# Patient Record
Sex: Female | Born: 1990 | Race: Black or African American | Hispanic: No | Marital: Single | Smoking: Current every day smoker
Health system: Southern US, Community
[De-identification: ages and names within clinical notes are randomized; demographics above are authoritative.]

## PROBLEM LIST (undated history)

## (undated) ENCOUNTER — Inpatient Hospital Stay (HOSPITAL_COMMUNITY): Payer: Self-pay

## (undated) DIAGNOSIS — N10 Acute tubulo-interstitial nephritis: Secondary | ICD-10-CM

## (undated) DIAGNOSIS — R509 Fever, unspecified: Secondary | ICD-10-CM

## (undated) DIAGNOSIS — Z789 Other specified health status: Secondary | ICD-10-CM

## (undated) HISTORY — PX: NO PAST SURGERIES: SHX2092

---
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
## 2008-03-11 ENCOUNTER — Emergency Department (HOSPITAL_COMMUNITY): Admission: EM | Admit: 2008-03-11 | Discharge: 2008-03-12 | Payer: Self-pay | Admitting: Emergency Medicine

## 2008-06-06 ENCOUNTER — Emergency Department (HOSPITAL_COMMUNITY): Admission: EM | Admit: 2008-06-06 | Discharge: 2008-06-06 | Payer: Self-pay | Admitting: Emergency Medicine

## 2009-12-03 ENCOUNTER — Emergency Department (HOSPITAL_COMMUNITY): Admission: EM | Admit: 2009-12-03 | Discharge: 2009-12-03 | Payer: Self-pay | Admitting: Family Medicine

## 2011-08-25 LAB — RAPID STREP SCREEN (MED CTR MEBANE ONLY): Streptococcus, Group A Screen (Direct): NEGATIVE

## 2012-09-12 ENCOUNTER — Emergency Department (HOSPITAL_COMMUNITY)
Admission: EM | Admit: 2012-09-12 | Discharge: 2012-09-12 | Disposition: A | Discharge status: Home / Self Care | Payer: Self-pay | Attending: Emergency Medicine | Admitting: Emergency Medicine

## 2012-09-12 ENCOUNTER — Emergency Department (HOSPITAL_COMMUNITY): Payer: Self-pay

## 2012-09-12 ENCOUNTER — Encounter (HOSPITAL_COMMUNITY): Payer: Self-pay | Admitting: Emergency Medicine

## 2012-09-12 DIAGNOSIS — O200 Threatened abortion: Secondary | ICD-10-CM | POA: Insufficient documentation

## 2012-09-12 DIAGNOSIS — Z3490 Encounter for supervision of normal pregnancy, unspecified, unspecified trimester: Secondary | ICD-10-CM

## 2012-09-12 LAB — CBC WITH DIFFERENTIAL/PLATELET
Basophils Absolute: 0 10*3/uL (ref 0.0–0.1)
Lymphocytes Relative: 30 % (ref 12–46)
Lymphs Abs: 1.2 10*3/uL (ref 0.7–4.0)
Neutro Abs: 2.4 10*3/uL (ref 1.7–7.7)
Neutrophils Relative %: 64 % (ref 43–77)
Platelets: 177 10*3/uL (ref 150–400)
RBC: 4.7 MIL/uL (ref 3.87–5.11)
RDW: 14.1 % (ref 11.5–15.5)
WBC: 3.8 10*3/uL — ABNORMAL LOW (ref 4.0–10.5)

## 2012-09-12 LAB — URINALYSIS, ROUTINE W REFLEX MICROSCOPIC
Hgb urine dipstick: NEGATIVE
Nitrite: NEGATIVE
Specific Gravity, Urine: 1.003 — ABNORMAL LOW (ref 1.005–1.030)
Urobilinogen, UA: 0.2 mg/dL (ref 0.0–1.0)

## 2012-09-12 LAB — URINE MICROSCOPIC-ADD ON

## 2012-09-12 LAB — WET PREP, GENITAL: Trich, Wet Prep: NONE SEEN

## 2012-09-12 MED ORDER — NITROFURANTOIN MONOHYD MACRO 100 MG PO CAPS: 100.0000 mg | ORAL_CAPSULE | Freq: Two times a day (BID) | ORAL | Status: DC

## 2012-09-12 NOTE — ED Notes (Signed)
[facility] called and said that pt is not a candidate for Rhogam bc she is B+

## 2012-09-12 NOTE — ED Notes (Signed)
Pt resting comfortably states her pain "just went away."

## 2012-09-12 NOTE — ED Provider Notes (Signed)
Medical screening examination/treatment/procedure(s) were performed by non-physician practitioner and as supervising physician I was immediately available for consultation/collaboration.   Gerhard Munch, MD 09/12/12 2028

## 2012-09-12 NOTE — ED Provider Notes (Signed)
History     CSN: [id number]  Arrival date & time 09/12/12  1707   First MD Initiated Contact with Patient 09/12/12 1708      Chief Complaint  Patient presents with  . Abdominal Pain    (Consider location/radiation/quality/duration/timing/severity/associated sxs/prior treatment) HPI Comments: This is a 21 year old female, who presents to the ED with a chief complaint of abdominal pain.  The patient states that the pain began 2 days ago.  She states that the pain is in her right lower quadrant.  The pain is accompanied by nausea and intermittent vomiting.  Patient denies fever, anorexia, diarrhea, and constipation.  The pain does not radiate.  The patient has not tried taking anything for the pain.  The patient states that her LMP was in August.  The history is provided by the patient. No language interpreter was used.    No past medical history on file.  No past surgical history on file.  No family history on file.  History  Substance Use Topics  . Smoking status: Not on file  . Smokeless tobacco: Not on file  . Alcohol Use: Not on file    OB History    No data available      Review of Systems  Constitutional: Negative for fever and chills.  HENT: Negative for congestion.   Eyes: Negative for visual disturbance.  Respiratory: Negative for chest tightness and shortness of breath.   Cardiovascular: Negative for chest pain.  Gastrointestinal: Positive for abdominal pain. Negative for nausea, vomiting, diarrhea, constipation and blood in stool.  Genitourinary: Positive for menstrual problem. Negative for dysuria, vaginal bleeding, vaginal discharge and vaginal pain.  Musculoskeletal: Negative for back pain.  Neurological: Negative for weakness.  Psychiatric/Behavioral: The patient is not nervous/anxious.   All other systems reviewed and are negative.    Allergies  Review of patient's allergies indicates no known allergies.  Home Medications  No current outpatient  prescriptions on file.  BP 120/67  Pulse 84  Temp 97.8 F (36.6 C) (Oral)  Resp 18  SpO2 100%  LMP 06/28/2012  Physical Exam  Nursing note and vitals reviewed. Constitutional: She is oriented to person, place, and time. She appears well-developed and well-nourished.  HENT:  Head: Normocephalic and atraumatic.  Eyes: Conjunctivae normal and EOM are normal. Pupils are equal, round, and reactive to light.  Neck: Normal range of motion. Neck supple.  Cardiovascular: Normal rate, regular rhythm and normal heart sounds.   Pulmonary/Chest: Effort normal and breath sounds normal.  Abdominal: Soft. Bowel sounds are normal. She exhibits no mass. There is tenderness. There is no rebound and no guarding.  Genitourinary: Pelvic exam was performed with patient supine. There is no rash, tenderness, lesion or injury on the right labia. There is no rash, tenderness, lesion or injury on the left labia. Uterus is not deviated, not enlarged, not fixed and not tender. Cervix exhibits discharge. Cervix exhibits no motion tenderness and no friability. Right adnexum displays no mass, no tenderness and no fullness. Left adnexum displays no mass, no tenderness and no fullness. No erythema, tenderness or bleeding around the vagina. No foreign body around the vagina. No signs of injury around the vagina. Vaginal discharge found.       Small amount of blood coming from the OS  Musculoskeletal: Normal range of motion.  Neurological: She is alert and oriented to person, place, and time.  Skin: Skin is warm and dry.  Psychiatric: She has a normal mood and affect. Her  behavior is normal. Judgment and thought content normal.    ED Course  Procedures (including critical care time)   Labs Reviewed  URINALYSIS, ROUTINE W REFLEX MICROSCOPIC  CBC WITH DIFFERENTIAL  GC/CHLAMYDIA PROBE AMP, GENITAL  WET PREP, GENITAL   Results for orders placed during the hospital encounter of 09/12/12  URINALYSIS, ROUTINE W REFLEX  MICROSCOPIC      Component Value Range   Color, Urine YELLOW  YELLOW   APPearance CLOUDY (*) CLEAR   Specific Gravity, Urine 1.003 (*) 1.005 - 1.030   pH 6.0  5.0 - 8.0   Glucose, UA NEGATIVE  NEGATIVE mg/dL   Hgb urine dipstick NEGATIVE  NEGATIVE   Bilirubin Urine NEGATIVE  NEGATIVE   Ketones, ur NEGATIVE  NEGATIVE mg/dL   Protein, ur NEGATIVE  NEGATIVE mg/dL   Urobilinogen, UA 0.2  0.0 - 1.0 mg/dL   Nitrite NEGATIVE  NEGATIVE   Leukocytes, UA MODERATE (*) NEGATIVE  CBC WITH DIFFERENTIAL      Component Value Range   WBC 3.8 (*) 4.0 - 10.5 K/uL   RBC 4.70  3.87 - 5.11 MIL/uL   Hemoglobin 12.1  12.0 - 15.0 g/dL   HCT 45.4  09.8 - 11.9 %   MCV 76.8 (*) 78.0 - 100.0 fL   MCH 25.7 (*) 26.0 - 34.0 pg   MCHC 33.5  30.0 - 36.0 g/dL   RDW 14.7  82.9 - 56.2 %   Platelets 177  150 - 400 K/uL   Neutrophils Relative 64  43 - 77 %   Neutro Abs 2.4  1.7 - 7.7 K/uL   Lymphocytes Relative 30  12 - 46 %   Lymphs Abs 1.2  0.7 - 4.0 K/uL   Monocytes Relative 5  3 - 12 %   Monocytes Absolute 0.2  0.1 - 1.0 K/uL   Eosinophils Relative 1  0 - 5 %   Eosinophils Absolute 0.0  0.0 - 0.7 K/uL   Basophils Relative 0  0 - 1 %   Basophils Absolute 0.0  0.0 - 0.1 K/uL  WET PREP, GENITAL      Component Value Range   Yeast Wet Prep HPF POC NONE SEEN  NONE SEEN   Trich, Wet Prep NONE SEEN  NONE SEEN   Clue Cells Wet Prep HPF POC FEW (*) NONE SEEN   WBC, Wet Prep HPF POC FEW (*) NONE SEEN  POCT PREGNANCY, URINE      Component Value Range   Preg Test, Ur POSITIVE (*) NEGATIVE  RH IG WORKUP (INCLUDES ABO/RH)      Component Value Range   Gestational Age(Wks) 9     ABO/RH(D) B POS     No rh immune globuloin       Value: NOT A RH IMMUNE GLOBULIN CANDIDATE, PT RH POSITIVE RESULT CALLED TO, READ BACK BY AND VERIFIED WITH: Sampson Si RN 1922 09/12/12 A NAVARRO  URINE MICROSCOPIC-ADD ON      Component Value Range   Squamous Epithelial / LPF FEW (*) RARE   WBC, UA 11-20  <3 WBC/hpf   Bacteria, UA FEW (*)  RARE  ABO/RH      Component Value Range   ABO/RH(D) B POS     US Ob Comp Less 14 Wks  09/12/2012  *RADIOLOGY REPORT*  Clinical Data: Right lower quadrant abdominal pain.  OBSTETRIC <14 WK [place] AND TRANSVAGINAL OB US  Technique:  Both transabdominal and transvaginal ultrasound examinations were performed for complete evaluation of the gestation as  well as the maternal uterus, adnexal regions, and pelvic cul-de-sac.  Transvaginal technique was performed to assess early pregnancy.  Comparison:  None.  Intrauterine gestational sac:  Visualized/normal in shape. Embryo: Present Cardiac Activity: Present Heart Rate: 168, within normal limits bpm  CRL: 6.0  mm  12 w  3 d             [place] EDC: 03/18/2013  Maternal uterus/adnexae: Normal.  IMPRESSION: Single intrauterine pregnancy with an estimated gestational age of [redacted] weeks and 3 days.  Normal heart rate and movement are identified.   Original Report Authenticated By: Jamesetta Orleans. MATTERN, M.D.        1. Pregnancy   2. Threatened abortion in early pregnancy       MDM  21 year old with abdominal pain.  Positive pregnancy.  U/S results are detailed above.  I am going to refer the patient to OBGYN for care of this pregnancy.  As there was a small amount of blood from the cervical OS, will educate the patient regarding threatened abortion.  Return precautions have been given.  Patient also has UTI, will give Macrobid. The patient is stable and ready for discharge.  I have discussed this patient with Dr. Jeraldine Loots, who agrees with this treatment plan.        Roxy Horseman, PA-C 09/12/12 2012

## 2012-09-12 NOTE — ED Notes (Signed)
Pt c/o lower abd pain for the last 2 days. Pt states pain is sharp and intermittent. Pt denies n/v/d.

## 2012-09-12 NOTE — ED Notes (Signed)
Bedside report received from previous RN 

## 2012-09-13 LAB — GC/CHLAMYDIA PROBE AMP, GENITAL
Chlamydia, DNA Probe: POSITIVE — AB
GC Probe Amp, Genital: NEGATIVE

## 2012-09-14 NOTE — ED Notes (Signed)
+  Chlamydia Chart sent to EDP office for review.  

## 2012-09-17 NOTE — ED Notes (Signed)
Chart returned from EDP office . Give Azithromycin 1 gram po single dose.All sexual partners must be tested and treated must be tested and treated. Must follow up with OB this week per [facility].

## 2012-10-22 NOTE — ED Notes (Signed)
Unable to contact via phone. Letter sent.

## 2012-11-03 ENCOUNTER — Emergency Department (HOSPITAL_COMMUNITY): Payer: Self-pay

## 2012-11-03 ENCOUNTER — Emergency Department (HOSPITAL_COMMUNITY)
Admission: EM | Admit: 2012-11-03 | Discharge: 2012-11-03 | Disposition: A | Discharge status: Home / Self Care | Payer: Self-pay | Attending: Emergency Medicine | Admitting: Emergency Medicine

## 2012-11-03 ENCOUNTER — Encounter (HOSPITAL_COMMUNITY): Payer: Self-pay | Admitting: *Deleted

## 2012-11-03 DIAGNOSIS — O9989 Other specified diseases and conditions complicating pregnancy, childbirth and the puerperium: Secondary | ICD-10-CM | POA: Insufficient documentation

## 2012-11-03 DIAGNOSIS — Z79899 Other long term (current) drug therapy: Secondary | ICD-10-CM | POA: Insufficient documentation

## 2012-11-03 DIAGNOSIS — N12 Tubulo-interstitial nephritis, not specified as acute or chronic: Secondary | ICD-10-CM | POA: Insufficient documentation

## 2012-11-03 DIAGNOSIS — F17200 Nicotine dependence, unspecified, uncomplicated: Secondary | ICD-10-CM | POA: Insufficient documentation

## 2012-11-03 LAB — COMPREHENSIVE METABOLIC PANEL
ALT: 7 U/L (ref 0–35)
AST: 13 U/L (ref 0–37)
Alkaline Phosphatase: 50 U/L (ref 39–117)
Calcium: 8.8 mg/dL (ref 8.4–10.5)
Glucose, Bld: 90 mg/dL (ref 70–99)
Potassium: 2.9 mEq/L — ABNORMAL LOW (ref 3.5–5.1)
Sodium: 132 mEq/L — ABNORMAL LOW (ref 135–145)
Total Protein: 6.2 g/dL (ref 6.0–8.3)

## 2012-11-03 LAB — CBC WITH DIFFERENTIAL/PLATELET
Basophils Absolute: 0 10*3/uL (ref 0.0–0.1)
Eosinophils Absolute: 0 10*3/uL (ref 0.0–0.7)
Eosinophils Relative: 0 % (ref 0–5)
Lymphocytes Relative: 13 % (ref 12–46)
Lymphs Abs: 0.8 10*3/uL (ref 0.7–4.0)
MCH: 26.9 pg (ref 26.0–34.0)
MCV: 78.4 fL (ref 78.0–100.0)
Neutrophils Relative %: 82 % — ABNORMAL HIGH (ref 43–77)
Platelets: 168 10*3/uL (ref 150–400)
RBC: 3.98 MIL/uL (ref 3.87–5.11)
RDW: 14.3 % (ref 11.5–15.5)
WBC: 6.6 10*3/uL (ref 4.0–10.5)

## 2012-11-03 LAB — URINE MICROSCOPIC-ADD ON

## 2012-11-03 LAB — URINALYSIS, ROUTINE W REFLEX MICROSCOPIC
Bilirubin Urine: NEGATIVE
Glucose, UA: NEGATIVE mg/dL
Specific Gravity, Urine: 1.015 (ref 1.005–1.030)
Urobilinogen, UA: 1 mg/dL (ref 0.0–1.0)
pH: 6.5 (ref 5.0–8.0)

## 2012-11-03 MED ORDER — OXYCODONE-ACETAMINOPHEN 5-325 MG PO TABS: 1.0000 | ORAL_TABLET | ORAL | Status: DC | PRN

## 2012-11-03 MED ORDER — ONDANSETRON 8 MG PO TBDP: 8.0000 mg | ORAL_TABLET | Freq: Three times a day (TID) | ORAL | Status: DC | PRN

## 2012-11-03 MED ORDER — DEXTROSE 5 % IV SOLN
1.0000 g | Freq: Once | INTRAVENOUS | Status: AC
  Administered 2012-11-03: 1 g via INTRAVENOUS
  Filled 2012-11-03: qty 10

## 2012-11-03 MED ORDER — MORPHINE SULFATE 4 MG/ML IJ SOLN
6.0000 mg | Freq: Once | INTRAMUSCULAR | Status: AC
  Administered 2012-11-03: 6 mg via INTRAVENOUS
  Filled 2012-11-03: qty 2

## 2012-11-03 MED ORDER — CEPHALEXIN 500 MG PO CAPS: 500.0000 mg | ORAL_CAPSULE | Freq: Four times a day (QID) | ORAL | Status: DC

## 2012-11-03 MED ORDER — POTASSIUM CHLORIDE CRYS ER 20 MEQ PO TBCR
40.0000 meq | EXTENDED_RELEASE_TABLET | Freq: Once | ORAL | Status: AC
  Administered 2012-11-03: 40 meq via ORAL
  Filled 2012-11-03: qty 2

## 2012-11-03 NOTE — ED Notes (Signed)
MD at bedside. 

## 2012-11-03 NOTE — ED Notes (Addendum)
Pt states she has had right abdominal pain radiating to right flank since last night.  Pt denies N/V/D and fever.  Pt denies blood in urine, burning with urination or urinary frequency.  Pt denies vaginal discharge.  Pt is @ 4 months pregnant and has had no prenatal care.

## 2012-11-03 NOTE — ED Provider Notes (Signed)
History     CSN: [id number]  Arrival date & time 11/03/12  1140   First MD Initiated Contact with Patient 11/03/12 1158      Chief Complaint  Patient presents with  . Abdominal Pain     The history is provided by the patient.   Patient reports developing acute right-sided abdominal pain yesterday has been radiating to her flank since last night.  No nausea vomiting or diarrhea.  She reports urinary symptoms or vaginal discharge.  She is [redacted] weeks pregnant.  She's had no prenatal care to this point.  She had ultrasound obtained on October the demonstrate an intrauterine pregnancy.  She has no history of kidney stones.  There is no radiation or pain down her right groin.  No new vaginal complaints.  No urinary complaints.  She reports developing chills last night without documented fever.  No fever today.  Her pain is constant and is moderate in severity   History reviewed. No pertinent past medical history.  History reviewed. No pertinent past surgical history.  No family history on file.  History  Substance Use Topics  . Smoking status: Current Some Day Smoker    Types: Cigarettes  . Smokeless tobacco: Never Used  . Alcohol Use: No    OB History    Grav Para Term Preterm Abortions TAB SAB Ect Mult Living   2 1              Review of Systems  All other systems reviewed and are negative.    Allergies  Review of patient's allergies indicates no known allergies.  Home Medications   Current Outpatient Rx  Name  Route  Sig  Dispense  Refill  . NITROFURANTOIN MONOHYD MACRO 100 MG PO CAPS   Oral   Take 1 capsule (100 mg total) by mouth 2 (two) times daily.   10 capsule   0     BP 126/79  Pulse 84  Temp 98 F (36.7 C) (Oral)  Resp 16  SpO2 99%  LMP 06/28/2012  Physical Exam  Nursing note and vitals reviewed. Constitutional: She is oriented to person, place, and time. She appears well-developed and well-nourished. No distress.  HENT:  Head: Normocephalic  and atraumatic.  Eyes: EOM are normal.  Neck: Normal range of motion.  Cardiovascular: Normal rate, regular rhythm and normal heart sounds.   Pulmonary/Chest: Effort normal and breath sounds normal.  Abdominal: Soft. She exhibits no distension.       Right-sided abdominal tenderness  Genitourinary:       Right-sided flank tenderness  Musculoskeletal: Normal range of motion.  Neurological: She is alert and oriented to person, place, and time.  Skin: Skin is warm and dry.  Psychiatric: She has a normal mood and affect. Judgment normal.    ED Course  Procedures (including critical care time)  Labs Reviewed  CBC WITH DIFFERENTIAL - Abnormal; Notable for the following:    Hemoglobin 10.7 (*)     HCT 31.2 (*)     Neutrophils Relative 82 (*)     All other components within normal limits  COMPREHENSIVE METABOLIC PANEL - Abnormal; Notable for the following:    Sodium 132 (*)     Potassium 2.9 (*)     Creatinine, Ser 0.42 (*)     Albumin 2.8 (*)     All other components within normal limits  URINALYSIS, ROUTINE W REFLEX MICROSCOPIC - Abnormal; Notable for the following:    APPearance CLOUDY (*)  Hgb urine dipstick LARGE (*)     Protein, ur 30 (*)     Leukocytes, UA LARGE (*)     All other components within normal limits  URINE MICROSCOPIC-ADD ON - Abnormal; Notable for the following:    Squamous Epithelial / LPF FEW (*)     Bacteria, UA MANY (*)     All other components within normal limits  LIPASE, BLOOD  URINE CULTURE   US Abdomen Complete  11/03/2012  *RADIOLOGY REPORT*  Clinical Data:  21 year old pregnant female with abdominal pain.  ABDOMINAL ULTRASOUND COMPLETE  Comparison:  None  Findings:  Gallbladder:  The gallbladder is unremarkable. There is no evidence of gallstones, gallbladder wall thickening, or pericholecystic fluid.  Common Bile Duct:  There is no evidence of intrahepatic or extrahepatic biliary dilation. The CBD measures 2.9 mm in greatest diameter.  Liver:   The liver is within normal limits in parenchymal echogenicity. No focal abnormalities are identified.  IVC:  Appears normal.  Pancreas:  Although the pancreas is difficult to visualize in its entirety, no focal pancreatic abnormality is identified.  Spleen:  Within normal limits in size and echotexture.  Right kidney:  The right kidney is normal in size and parenchymal echogenicity.  There is no evidence of solid mass or definite renal calculi.  Very mild right hydronephrosis is likely related to pregnancy.  The right kidney measures 13.7 cm.  Left kidney:  The left kidney is normal in size and parenchymal echogenicity.  There is no evidence of solid mass, hydronephrosis or definite renal calculi.   The left kidney measures 12.3 cm.  Abdominal Aorta:  No abdominal aortic aneurysm identified.  Bladder:  Debris within the bladder is noted which may signify infection.  There is no evidence of ascites.  IMPRESSION: Debris within the bladder which may signify infection.  Mild right hydronephrosis - most likely related to hydronephrosis of pregnancy but correlate clinically.  No other significant abnormalities identified.   Original Report Authenticated By: Harmon Pier, M.D.    I personally reviewed the imaging tests through PACS system I reviewed available ER/hospitalization records through the EMR   1. Pyelonephritis       MDM  She appears to have a urinary tract infection and likely right-sided pyelonephritis.  This may be isolated of pyelonephritis however on ultrasound she appears to have a small amount of right-sided hydronephrosis as compared to her left side.  There is no obvious stone noted.  Radiology comments that this may be hydronephrosis secondary to pregnancy alone however given her pain and her urinary tract infection i will speak with the urologist  4:10 PM I spoke with the urologist Dr. Isabel Caprice, who recommends close followup with OB/GYN and return to [facility] hospital for new or worsening  symptoms.  He states this time it does not Selleck the patient needs a ureteral stent.  She is afebrile her white count is normal.  After pain medication she feels much better.  She'll be discharged home on Keflex, pain medicine, and nausea medicine.  She really has no nausea or vomiting at this time.  I've asked that the patient return immediately to the ER [facility] for any new or worsening symptoms including fever, worsening pain, severe nausea vomiting and inability to keep fluids down.  She is currently scheduled to followup at [facility] clinic for routine obstetrical care.  I recommended prenatal vitamins and to not smoke, drink alcohol, use illicit drugs.  The patient is agreeable  to outpatient plan.  She was given potassium supplementation in the emergency department      Lyanne Co, MD 11/03/12 [phone]

## 2012-11-05 LAB — URINE CULTURE

## 2012-11-06 NOTE — ED Notes (Signed)
+   urine  Patient treated appropriately -sensitive to same-chart appended per protocol MD.  

## 2012-11-13 ENCOUNTER — Inpatient Hospital Stay (HOSPITAL_COMMUNITY)
Admission: AD | Admit: 2012-11-13 | Discharge: 2012-11-14 | Disposition: A | Discharge status: Home / Self Care | Payer: Self-pay | Source: Ambulatory Visit | Attending: Obstetrics and Gynecology | Admitting: Obstetrics and Gynecology

## 2012-11-13 ENCOUNTER — Encounter (HOSPITAL_COMMUNITY): Payer: Self-pay | Admitting: Advanced Practice Midwife

## 2012-11-13 DIAGNOSIS — O99891 Other specified diseases and conditions complicating pregnancy: Secondary | ICD-10-CM

## 2012-11-13 DIAGNOSIS — R1010 Upper abdominal pain, unspecified: Secondary | ICD-10-CM

## 2012-11-13 DIAGNOSIS — Z3490 Encounter for supervision of normal pregnancy, unspecified, unspecified trimester: Secondary | ICD-10-CM

## 2012-11-13 DIAGNOSIS — O0930 Supervision of pregnancy with insufficient antenatal care, unspecified trimester: Secondary | ICD-10-CM

## 2012-11-13 DIAGNOSIS — R109 Unspecified abdominal pain: Secondary | ICD-10-CM

## 2012-11-13 HISTORY — DX: Other specified health status: Z78.9

## 2012-11-13 MED ORDER — GI COCKTAIL ~~LOC~~
30.0000 mL | Freq: Once | ORAL | Status: AC
  Administered 2012-11-13: 30 mL via ORAL
  Filled 2012-11-13: qty 30

## 2012-11-13 NOTE — MAU Provider Note (Signed)
  History     CSN: [id number]  Arrival date and time: 11/13/12 2214   First Provider Initiated Contact with Patient 11/13/12 2307      Chief Complaint  Patient presents with  . Abdominal Pain   HPI Kristen Garza 21 y.o. [redacted]w[redacted]d  Comes to MAU tonight with upper abdominal pain.  Denies any vomiting.  Also needs verification of pregnancy.  Has an appointment in the [facility] clinic on 11-19-12 to begin prenatal care.  OB History    Grav Para Term Preterm Abortions TAB SAB Ect Mult Living   2 1 1       1       Past Medical History  Diagnosis Date  . No pertinent past medical history     Past Surgical History  Procedure Date  . No past surgeries     No family history on file.  History  Substance Use Topics  . Smoking status: Current Some Day Smoker -- 0.2 packs/day    Types: Cigarettes  . Smokeless tobacco: Never Used  . Alcohol Use: No    Allergies: No Known Allergies  Prescriptions prior to admission  Medication Sig Dispense Refill  . cephALEXin (KEFLEX) 500 MG capsule Take 1 capsule (500 mg total) by mouth 4 (four) times daily.  28 capsule  0  . ondansetron (ZOFRAN ODT) 8 MG disintegrating tablet Take 1 tablet (8 mg total) by mouth every 8 (eight) hours as needed for nausea.  12 tablet  0  . oxyCODONE-acetaminophen (PERCOCET/ROXICET) 5-325 MG per tablet Take 1 tablet by mouth every 4 (four) hours as needed for pain.  20 tablet  0  . Prenatal Vit-Fe Fumarate-FA (MULTIVITAMIN-PRENATAL) 27-0.8 MG TABS Take 1 tablet by mouth daily.        Review of Systems  Constitutional: Negative for fever.  Gastrointestinal: Positive for abdominal pain. Negative for nausea, vomiting, diarrhea and constipation.  Genitourinary:       No vaginal discharge. No vaginal bleeding. No dysuria.   Physical Exam   Blood pressure 124/75, pulse 85, temperature 98.1 F (36.7 C), temperature source Oral, resp. rate 16, height 5\' 4"  (1.626 m), weight 70.67 kg (155 lb 12.8 oz), last  menstrual period 07/04/2012.  Physical Exam  Nursing note and vitals reviewed. Constitutional: She is oriented to person, place, and time. She appears well-developed and well-nourished.  HENT:  Head: Normocephalic.  Eyes: EOM are normal.  Neck: Neck supple.  GI: Soft. There is tenderness. There is no rebound and no guarding.       FHT 140  Musculoskeletal: Normal range of motion.  Neurological: She is alert and oriented to person, place, and time.  Skin: Skin is warm and dry.  Psychiatric: She has a normal mood and affect.    MAU Course  Procedures  MDM   Assessment and Plan  Upper abdominal pain likely related to high fat foods in pregnancy]  Plan GI cocktail now. Pregnancy verification given Keep appointment in the clinic on 11-19-12 Expect ultrasound to call you and schedule an ultrasound. Do not eat high fat foods or other foods which cause upper abdominal pain for you. You can chew Tums by the package directions to help you when you have this type of pain.   [name] 11/13/2012, 11:12 PM

## 2012-11-13 NOTE — MAU Note (Signed)
Pt unsure of LMP, thinks it was in August.  +UPT at [facility] in October.  Tonight having upper abd pain.

## 2012-11-14 NOTE — MAU Provider Note (Signed)
Attestation of Attending Supervision of Advanced Practitioner (CNM/NP): Evaluation and management procedures were performed by the Advanced Practitioner under my supervision and collaboration.  I have reviewed the Advanced Practitioner's note and chart, and I agree with the management and plan.  Dandra Shambaugh 11/14/2012 7:30 AM   

## 2012-11-19 ENCOUNTER — Encounter: Payer: Self-pay | Admitting: Obstetrics and Gynecology

## 2012-11-23 ENCOUNTER — Encounter: Payer: Self-pay | Admitting: Advanced Practice Midwife

## 2012-11-23 ENCOUNTER — Ambulatory Visit (HOSPITAL_COMMUNITY)
Admission: RE | Admit: 2012-11-23 | Discharge: 2012-11-23 | Disposition: A | Discharge status: Home / Self Care | Payer: Self-pay | Source: Ambulatory Visit | Attending: Advanced Practice Midwife | Admitting: Advanced Practice Midwife

## 2012-11-23 DIAGNOSIS — Z363 Encounter for antenatal screening for malformations: Secondary | ICD-10-CM | POA: Insufficient documentation

## 2012-11-23 DIAGNOSIS — O0930 Supervision of pregnancy with insufficient antenatal care, unspecified trimester: Secondary | ICD-10-CM

## 2012-11-23 DIAGNOSIS — Z3490 Encounter for supervision of normal pregnancy, unspecified, unspecified trimester: Secondary | ICD-10-CM

## 2012-11-23 DIAGNOSIS — Z1389 Encounter for screening for other disorder: Secondary | ICD-10-CM | POA: Insufficient documentation

## 2012-11-23 DIAGNOSIS — O358XX0 Maternal care for other (suspected) fetal abnormality and damage, not applicable or unspecified: Secondary | ICD-10-CM | POA: Insufficient documentation

## 2012-11-23 NOTE — Progress Notes (Signed)
Patient arrived for ultrasound appointment, acknowledged that she did not arrive for [facility] appointment on 11/19/2012.  Patient given [facility] phone number and told to call to reschedule appointment.

## 2012-11-28 NOTE — L&D Delivery Note (Signed)
Blyss A Marohl  Is a 22 y.o. [id number] presenting at [redacted]w[redacted]d for IOL for post-dates. She progressed on pitocin and went from 5 to 10 cm very quickly. Baby's head was delivered before the MD could be present.   Delivery Note At 8:25 PM a viable female was delivered via Vaginal, Spontaneous Delivery (Presentation: OA).  APGAR: 9, 9; weight 7 lb 3 oz (3260 g).   Placenta status: Intact, Spontaneous.  Cord: 3 vessels with the following complications: None.  Cord pH: n/a.  800 mcg of cytotec was placed rectally for continued bleeding after delivery of placenta.  Anesthesia: Epidural  Episiotomy: None Lacerations: 2nd degree Suture Repair: 3.0 vicryl Est. Blood Loss (mL): 500  Mom to postpartum.  Baby to nursery-stable.  Napoleon Form 03/26/2013, 3:03 AM

## 2013-03-17 ENCOUNTER — Inpatient Hospital Stay (HOSPITAL_COMMUNITY)
Admission: AD | Admit: 2013-03-17 | Discharge: 2013-03-17 | Disposition: A | Discharge status: Home / Self Care | Payer: Medicaid Other | Source: Ambulatory Visit | Attending: Obstetrics & Gynecology | Admitting: Obstetrics & Gynecology

## 2013-03-17 ENCOUNTER — Encounter (HOSPITAL_COMMUNITY): Payer: Self-pay | Admitting: *Deleted

## 2013-03-17 DIAGNOSIS — R109 Unspecified abdominal pain: Secondary | ICD-10-CM | POA: Insufficient documentation

## 2013-03-17 DIAGNOSIS — O99891 Other specified diseases and conditions complicating pregnancy: Secondary | ICD-10-CM | POA: Insufficient documentation

## 2013-03-17 NOTE — MAU Note (Signed)
PT SAYS  SHE HAS BEEN HAVING ABD PAIN- THAT HURTS WORSE   AT 0030- .  WAS SEEN IN MAU  ANOTHER TIME.   SAID SHE CALLED  THE CLINIC  AND THEY TOLD TO COME  TO MAU  ON Monday.     NO VE.   DENIES HSV AND MRSA.

## 2013-03-18 ENCOUNTER — Encounter: Payer: Self-pay | Admitting: Obstetrics & Gynecology

## 2013-03-18 ENCOUNTER — Ambulatory Visit (INDEPENDENT_AMBULATORY_CARE_PROVIDER_SITE_OTHER): Payer: Self-pay | Admitting: Obstetrics & Gynecology

## 2013-03-18 ENCOUNTER — Telehealth (HOSPITAL_COMMUNITY): Payer: Self-pay | Admitting: *Deleted

## 2013-03-18 VITALS — BP 124/79 | Temp 98.0°F | Wt 178.0 lb

## 2013-03-18 DIAGNOSIS — A749 Chlamydial infection, unspecified: Secondary | ICD-10-CM

## 2013-03-18 DIAGNOSIS — O480 Post-term pregnancy: Secondary | ICD-10-CM

## 2013-03-18 DIAGNOSIS — O0930 Supervision of pregnancy with insufficient antenatal care, unspecified trimester: Secondary | ICD-10-CM

## 2013-03-18 DIAGNOSIS — O0933 Supervision of pregnancy with insufficient antenatal care, third trimester: Secondary | ICD-10-CM

## 2013-03-18 DIAGNOSIS — O98319 Other infections with a predominantly sexual mode of transmission complicating pregnancy, unspecified trimester: Secondary | ICD-10-CM

## 2013-03-18 DIAGNOSIS — O09899 Supervision of other high risk pregnancies, unspecified trimester: Secondary | ICD-10-CM

## 2013-03-18 DIAGNOSIS — O99820 Streptococcus B carrier state complicating pregnancy: Secondary | ICD-10-CM

## 2013-03-18 DIAGNOSIS — Z22330 Carrier of Group B streptococcus: Secondary | ICD-10-CM

## 2013-03-18 LAB — POCT URINALYSIS DIP (DEVICE)
Bilirubin Urine: NEGATIVE
Glucose, UA: NEGATIVE mg/dL
Ketones, ur: NEGATIVE mg/dL
pH: 7 (ref 5.0–8.0)

## 2013-03-18 LAB — CBC
HCT: 30.8 % — ABNORMAL LOW (ref 36.0–46.0)
Hemoglobin: 10.1 g/dL — ABNORMAL LOW (ref 12.0–15.0)
MCH: 24 pg — ABNORMAL LOW (ref 26.0–34.0)
MCHC: 32.8 g/dL (ref 30.0–36.0)
RBC: 4.2 MIL/uL (ref 3.87–5.11)

## 2013-03-18 NOTE — Progress Notes (Signed)
Nutrition note: 1st visit consult Pt has gained 38# @ 40w, which is slightly > expected. Pt reports eating 3 meals & 2 snacks/d.  Pt is taking PNV.  Pt reports no N&V or heartburn. NKFA. Pt received verbal & written education on general nutrition during pregnancy & BF. Disc importance of BF. Disc wt gain goals of 25-35# or 1#/wk. Pt agrees to cont taking PNV. Pt does not have WIC but plans to apply after delivery. Pt is unsure about BF but stated that she might try. F/u if referred Blondell Reveal, MS, RD, LDN

## 2013-03-18 NOTE — Progress Notes (Signed)
Pulse 78 Edema trace in fingers only.

## 2013-03-18 NOTE — Telephone Encounter (Signed)
Preadmission screen  

## 2013-03-18 NOTE — Patient Instructions (Signed)
NST performed today was reviewed and was found to be reactive.  Continue recommended antenatal testing and prenatal care.  

## 2013-03-18 NOTE — Addendum Note (Signed)
Addended by: Jaynie Collins A on: 03/18/2013 04:22 PM   Modules accepted: Orders

## 2013-03-18 NOTE — Progress Notes (Signed)
U/S and BPP scheduled 03/21/13 at 145pm. IOL scheduled 03/25/13 at 730 am.

## 2013-03-18 NOTE — Progress Notes (Signed)
   Subjective:    Jasira A Dejager is a G2P1001 [redacted]w[redacted]d being seen today for her first obstetrical visit.   Had some visits in [facility] but never received formal prenatal care.  Had chlam[facility] infection in 08/2012 [facility]; reports that her partner was also treated.  No other problems this pregnancy.  Her obstetrical history is significant for term pregnancy, insufficent care. Patient does intend to attempt to breast feed. Pregnancy history fully reviewed.  Patient reports no bleeding, no contractions, no cramping and no leaking.  Filed Vitals:   03/18/13 0958  BP: 124/79  Temp: 98 F (36.7 C)  Weight: 178 lb (80.74 kg)    HISTORY: OB History   Grav Para Term Preterm Abortions TAB SAB Ect Mult Living   2 1 1       1      # Outc Date GA Lbr Len/2nd Wgt Sex Del Anes PTL Lv   1 TRM 1/08 [redacted]w[redacted]d  7lb1oz(3.204kg) F SVD EPI  Yes   2 CUR              Past Medical History  Diagnosis Date  . No pertinent past medical history    Past Surgical History  Procedure Laterality Date  . No past surgeries     History reviewed. No pertinent family history.   Exam    Uterus:     Pelvic Exam:    Perineum: No Hemorrhoids, Normal Perineum   Vulva: normal   Vagina:  normal mucosa, normal discharge   Cervix: multiparous appearance and no bleeding following Pap   Adnexa: normal adnexa and no mass, fullness, tenderness   Bony Pelvis: average  System: Breast:  normal appearance, no masses or tenderness   Skin: normal coloration and turgor, no rashes   Neurologic: oriented, normal   Extremities: normal strength, tone, and muscle mass   HEENT PERRLA   Mouth/Teeth mucous membranes moist, pharynx normal without lesions   Neck supple and no masses   Cardiovascular: regular rate and rhythm   Respiratory:  appears well, vitals normal, no respiratory distress, acyanotic, normal RR   Abdomen: soft, non-tender; bowel sounds normal; no masses,  no organomegaly   Urinary:  urethral meatus normal      Assessment:    Pregnancy: G2P1001 Patient Active Problem List  Diagnosis  . Insufficient prenatal care     Plan:     Prenatal, 1 hr GTT labs drawn today Problem list reviewed and updated. Ultrasound discussed given size<<<dates; follow up ultrasound ordered and BPP ordered. Had reactive NST on 03/17/13. Induction of labor scheduled for [redacted] weeks GA.   Murat Rideout A 03/18/2013

## 2013-03-19 ENCOUNTER — Encounter: Payer: Self-pay | Admitting: Obstetrics & Gynecology

## 2013-03-19 LAB — DRUG SCREEN, URINE
Amphetamine Screen, Ur: NEGATIVE
Benzodiazepines.: NEGATIVE
Cocaine Metabolites: NEGATIVE
Marijuana Metabolite: NEGATIVE
Opiates: NEGATIVE
Phencyclidine (PCP): NEGATIVE

## 2013-03-19 LAB — GC/CHLAMYDIA PROBE AMP
CT Probe RNA: NEGATIVE
GC Probe RNA: NEGATIVE

## 2013-03-19 LAB — HIV ANTIBODY (ROUTINE TESTING W REFLEX): HIV: NONREACTIVE

## 2013-03-19 LAB — HEPATITIS B SURFACE ANTIGEN: Hepatitis B Surface Ag: NEGATIVE

## 2013-03-19 LAB — COMPREHENSIVE METABOLIC PANEL
Albumin: 3.3 g/dL — ABNORMAL LOW (ref 3.5–5.2)
Alkaline Phosphatase: 186 U/L — ABNORMAL HIGH (ref 39–117)
BUN: 4 mg/dL — ABNORMAL LOW (ref 6–23)
CO2: 22 mEq/L (ref 19–32)
Glucose, Bld: 131 mg/dL — ABNORMAL HIGH (ref 70–99)
Potassium: 3.3 mEq/L — ABNORMAL LOW (ref 3.5–5.3)
Sodium: 137 mEq/L (ref 135–145)
Total Protein: 5.9 g/dL — ABNORMAL LOW (ref 6.0–8.3)

## 2013-03-19 LAB — RPR

## 2013-03-19 LAB — GLUCOSE TOLERANCE, 1 HOUR (50G) W/O FASTING: Glucose, 1 Hour GTT: 132 mg/dL (ref 70–140)

## 2013-03-20 LAB — HEMOGLOBINOPATHY EVALUATION: Hgb F Quant: 0 % (ref 0.0–2.0)

## 2013-03-21 ENCOUNTER — Ambulatory Visit (HOSPITAL_COMMUNITY)
Admission: RE | Admit: 2013-03-21 | Discharge: 2013-03-21 | Disposition: A | Discharge status: Home / Self Care | Payer: Medicaid Other | Source: Ambulatory Visit | Attending: Obstetrics & Gynecology | Admitting: Obstetrics & Gynecology

## 2013-03-21 DIAGNOSIS — O0933 Supervision of pregnancy with insufficient antenatal care, third trimester: Secondary | ICD-10-CM

## 2013-03-21 DIAGNOSIS — O365990 Maternal care for other known or suspected poor fetal growth, unspecified trimester, not applicable or unspecified: Secondary | ICD-10-CM | POA: Insufficient documentation

## 2013-03-21 DIAGNOSIS — O480 Post-term pregnancy: Secondary | ICD-10-CM | POA: Insufficient documentation

## 2013-03-25 ENCOUNTER — Encounter: Payer: Self-pay | Admitting: *Deleted

## 2013-03-25 ENCOUNTER — Inpatient Hospital Stay (HOSPITAL_COMMUNITY): Payer: Medicaid Other | Admitting: Anesthesiology

## 2013-03-25 ENCOUNTER — Encounter (HOSPITAL_COMMUNITY): Payer: Self-pay | Admitting: Anesthesiology

## 2013-03-25 ENCOUNTER — Inpatient Hospital Stay (HOSPITAL_COMMUNITY)
Admission: RE | Admit: 2013-03-25 | Discharge: 2013-03-27 | DRG: 775 | Disposition: A | Discharge status: Home / Self Care | Payer: Medicaid Other | Source: Ambulatory Visit | Attending: Obstetrics and Gynecology | Admitting: Obstetrics and Gynecology

## 2013-03-25 ENCOUNTER — Encounter (HOSPITAL_COMMUNITY): Payer: Self-pay

## 2013-03-25 VITALS — BP 124/80 | HR 80 | Temp 98.3°F | Resp 18 | Ht 64.0 in | Wt 180.0 lb

## 2013-03-25 DIAGNOSIS — O99892 Other specified diseases and conditions complicating childbirth: Secondary | ICD-10-CM | POA: Diagnosis present

## 2013-03-25 DIAGNOSIS — Z22330 Carrier of Group B streptococcus: Secondary | ICD-10-CM

## 2013-03-25 DIAGNOSIS — O480 Post-term pregnancy: Principal | ICD-10-CM | POA: Diagnosis present

## 2013-03-25 DIAGNOSIS — O99820 Streptococcus B carrier state complicating pregnancy: Secondary | ICD-10-CM

## 2013-03-25 DIAGNOSIS — O9989 Other specified diseases and conditions complicating pregnancy, childbirth and the puerperium: Secondary | ICD-10-CM

## 2013-03-25 HISTORY — DX: Other specified health status: Z78.9

## 2013-03-25 LAB — CBC
HCT: 31.6 % — ABNORMAL LOW (ref 36.0–46.0)
Hemoglobin: 10 g/dL — ABNORMAL LOW (ref 12.0–15.0)
MCH: 24.4 pg — ABNORMAL LOW (ref 26.0–34.0)
MCV: 77.1 fL — ABNORMAL LOW (ref 78.0–100.0)
RBC: 4.1 MIL/uL (ref 3.87–5.11)
WBC: 8.4 10*3/uL (ref 4.0–10.5)

## 2013-03-25 MED ORDER — LIDOCAINE HCL (PF) 1 % IJ SOLN
INTRAMUSCULAR | Status: DC | PRN
  Administered 2013-03-25 (×4): 4 mL

## 2013-03-25 MED ORDER — DIPHENHYDRAMINE HCL 50 MG/ML IJ SOLN: 12.5000 mg | INTRAMUSCULAR | Status: DC | PRN

## 2013-03-25 MED ORDER — EPHEDRINE 5 MG/ML INJ
10.0000 mg | INTRAVENOUS | Status: DC | PRN
  Filled 2013-03-25: qty 4
  Filled 2013-03-25: qty 2

## 2013-03-25 MED ORDER — FENTANYL CITRATE 0.05 MG/ML IJ SOLN: 100.0000 ug | INTRAMUSCULAR | Status: DC | PRN

## 2013-03-25 MED ORDER — ACETAMINOPHEN 325 MG PO TABS: 650.0000 mg | ORAL_TABLET | ORAL | Status: DC | PRN

## 2013-03-25 MED ORDER — OXYTOCIN 40 UNITS IN LACTATED RINGERS INFUSION - SIMPLE MED
1.0000 m[IU]/min | INTRAVENOUS | Status: DC
  Administered 2013-03-25: 1 m[IU]/min via INTRAVENOUS
  Filled 2013-03-25: qty 1000

## 2013-03-25 MED ORDER — MISOPROSTOL 200 MCG PO TABS
ORAL_TABLET | ORAL | Status: AC
  Administered 2013-03-25: 800 ug
  Filled 2013-03-25: qty 4

## 2013-03-25 MED ORDER — LACTATED RINGERS IV SOLN: 500.0000 mL | INTRAVENOUS | Status: DC | PRN

## 2013-03-25 MED ORDER — PHENYLEPHRINE 40 MCG/ML (10ML) SYRINGE FOR IV PUSH (FOR BLOOD PRESSURE SUPPORT)
80.0000 ug | PREFILLED_SYRINGE | INTRAVENOUS | Status: DC | PRN
  Filled 2013-03-25: qty 5
  Filled 2013-03-25: qty 2

## 2013-03-25 MED ORDER — LACTATED RINGERS IV SOLN
INTRAVENOUS | Status: DC
  Administered 2013-03-25 (×2): via INTRAVENOUS

## 2013-03-25 MED ORDER — OXYTOCIN 40 UNITS IN LACTATED RINGERS INFUSION - SIMPLE MED
62.5000 mL/h | INTRAVENOUS | Status: DC
  Administered 2013-03-25: 62.5 mL/h via INTRAVENOUS
  Filled 2013-03-25: qty 1000

## 2013-03-25 MED ORDER — OXYCODONE-ACETAMINOPHEN 5-325 MG PO TABS: 1.0000 | ORAL_TABLET | ORAL | Status: DC | PRN

## 2013-03-25 MED ORDER — PHENYLEPHRINE 40 MCG/ML (10ML) SYRINGE FOR IV PUSH (FOR BLOOD PRESSURE SUPPORT)
80.0000 ug | PREFILLED_SYRINGE | INTRAVENOUS | Status: DC | PRN
  Filled 2013-03-25: qty 2

## 2013-03-25 MED ORDER — CITRIC ACID-SODIUM CITRATE 334-500 MG/5ML PO SOLN: 30.0000 mL | ORAL | Status: DC | PRN

## 2013-03-25 MED ORDER — PENICILLIN G POTASSIUM 5000000 UNITS IJ SOLR
2.5000 10*6.[IU] | INTRAVENOUS | Status: DC
  Administered 2013-03-25 (×2): 2.5 10*6.[IU] via INTRAVENOUS
  Filled 2013-03-25 (×8): qty 2.5

## 2013-03-25 MED ORDER — IBUPROFEN 600 MG PO TABS
600.0000 mg | ORAL_TABLET | Freq: Four times a day (QID) | ORAL | Status: DC | PRN
  Administered 2013-03-25: 600 mg via ORAL
  Filled 2013-03-25: qty 1

## 2013-03-25 MED ORDER — ONDANSETRON HCL 4 MG/2ML IJ SOLN: 4.0000 mg | Freq: Four times a day (QID) | INTRAMUSCULAR | Status: DC | PRN

## 2013-03-25 MED ORDER — LIDOCAINE HCL (PF) 1 % IJ SOLN
30.0000 mL | INTRAMUSCULAR | Status: DC | PRN
  Administered 2013-03-25: 30 mL via SUBCUTANEOUS
  Filled 2013-03-25 (×2): qty 30

## 2013-03-25 MED ORDER — OXYTOCIN BOLUS FROM INFUSION: 500.0000 mL | INTRAVENOUS | Status: DC

## 2013-03-25 MED ORDER — FENTANYL 2.5 MCG/ML BUPIVACAINE 1/10 % EPIDURAL INFUSION (WH - ANES)
14.0000 mL/h | INTRAMUSCULAR | Status: DC | PRN
  Administered 2013-03-25: 14 mL/h via EPIDURAL
  Filled 2013-03-25: qty 125

## 2013-03-25 MED ORDER — LACTATED RINGERS IV SOLN
500.0000 mL | Freq: Once | INTRAVENOUS | Status: AC
  Administered 2013-03-25: 500 mL via INTRAVENOUS

## 2013-03-25 MED ORDER — PENICILLIN G POTASSIUM 5000000 UNITS IJ SOLR
5.0000 10*6.[IU] | Freq: Once | INTRAVENOUS | Status: AC
  Administered 2013-03-25: 5 10*6.[IU] via INTRAVENOUS
  Filled 2013-03-25: qty 5

## 2013-03-25 MED ORDER — EPHEDRINE 5 MG/ML INJ
10.0000 mg | INTRAVENOUS | Status: DC | PRN
  Filled 2013-03-25: qty 2

## 2013-03-25 MED ORDER — TERBUTALINE SULFATE 1 MG/ML IJ SOLN: 0.2500 mg | Freq: Once | INTRAMUSCULAR | Status: AC | PRN

## 2013-03-25 NOTE — Progress Notes (Signed)
Kristen Garza is a 22 y.o. G2P1001 at [redacted]w[redacted]d by ultrasound ([redacted]w[redacted]d) admitted for induction of labor due to Post dates. Due date 03/18/13.  Subjective: Is feeling ctx, somewhat painfully  Objective: BP 104/49  Pulse 75  Temp(Src) 97.8 F (36.6 C) (Oral)  Resp 16  Ht 5\' 4"  (1.626 m)  Wt 180 lb (81.647 kg)  BMI 30.88 kg/m2  LMP 07/04/2012     FHT:  FHR: 130s bpm, variability: moderate,  accelerations:  Present,  decelerations:  Absent UC:   irregular, every 3-4 minutes SVE:   Dilation: 3.5 Effacement (%): 70 Station: -2 Exam by:: m wilkins rnc  Labs: Lab Results  Component Value Date   WBC 8.4 03/25/2013   HGB 10.0* 03/25/2013   HCT 31.6* 03/25/2013   MCV 77.1* 03/25/2013   PLT 219 03/25/2013    Assessment / Plan: Induction of labor due to postterm,  progressing well on pitocin  Labor: Progressing on Pitocin, will continue to increase then AROM Preeclampsia:  n/a Fetal Wellbeing:  Category I Pain Control:  Labor support without medications - does plan on epidural I/D:  GBS positive on PCN Anticipated MOD:  NSVD  Levert Feinstein 03/25/2013, 2:30 PM

## 2013-03-25 NOTE — Anesthesia Preprocedure Evaluation (Signed)
Anesthesia Evaluation  Patient identified by MRN, date of birth, ID band Patient awake    Reviewed: Allergy & Precautions, H&P , NPO status , Patient's Chart, lab work & pertinent test results, reviewed documented beta blocker date and time   History of Anesthesia Complications Negative for: history of anesthetic complications  Airway Mallampati: II TM Distance: >3 FB Neck ROM: full    Dental  (+) Teeth Intact   Pulmonary Current Smoker,  breath sounds clear to auscultation        Cardiovascular negative cardio ROS  Rhythm:regular Rate:Normal     Neuro/Psych negative neurological ROS  negative psych ROS   GI/Hepatic negative GI ROS, Neg liver ROS,   Endo/Other  negative endocrine ROS  Renal/GU negative Renal ROS  negative genitourinary   Musculoskeletal   Abdominal   Peds  Hematology  (+) anemia , hgb 10   Anesthesia Other Findings Facial piercings - asked to remove  Reproductive/Obstetrics (+) Pregnancy                           Anesthesia Physical Anesthesia Plan  ASA: II  Anesthesia Plan: Epidural   Post-op Pain Management:    Induction:   Airway Management Planned:   Additional Equipment:   Intra-op Plan:   Post-operative Plan:   Informed Consent: I have reviewed the patients History and Physical, chart, labs and discussed the procedure including the risks, benefits and alternatives for the proposed anesthesia with the patient or authorized representative who has indicated his/her understanding and acceptance.     Plan Discussed with:   Anesthesia Plan Comments:         Anesthesia Quick Evaluation

## 2013-03-25 NOTE — Anesthesia Procedure Notes (Signed)
Epidural Patient location during procedure: OB Start time: 03/25/2013 5:32 PM  Staffing Performed by: anesthesiologist   Preanesthetic Checklist Completed: patient identified, site marked, surgical consent, pre-op evaluation, timeout performed, IV checked, risks and benefits discussed and monitors and equipment checked  Epidural Patient position: sitting Prep: site prepped and draped and DuraPrep Patient monitoring: continuous pulse ox and blood pressure Approach: midline Injection technique: LOR air  Needle:  Needle type: Tuohy  Needle gauge: 17 G Needle length: 9 cm and 9 Needle insertion depth: 5 cm cm Catheter type: closed end flexible Catheter size: 19 Gauge Catheter at skin depth: 10 cm Test dose: negative  Assessment Events: blood not aspirated, injection not painful, no injection resistance, negative IV test and no paresthesia  Additional Notes Discussed risk of headache, infection, bleeding, nerve injury and failed or incomplete block.  Patient voices understanding and wishes to proceed.  Epidural placed easily on first attempt.  No paresthesia.  Patient tolerated procedure well with no apparent complications.  Jasmine December, MDReason for block:procedure for pain

## 2013-03-25 NOTE — H&P (Signed)
Attestation of Attending Supervision of Advanced Practitioner (CNM/NP): Evaluation and management procedures were performed by the Advanced Practitioner under my supervision and collaboration.  I have reviewed the Advanced Practitioner's note and chart, and I agree with the management and plan.  Kayin Kettering 03/25/2013 9:36 AM

## 2013-03-25 NOTE — H&P (Signed)
Bettyjane A Hams is a 22 y.o. female presenting for IOL 2/2 postdates. Maternal Medical History:  Reason for admission: Nausea.  Fetal activity: Perceived fetal activity is normal.      OB History   Grav Para Term Preterm Abortions TAB SAB Ect Mult Living   2 1 1       1      Past Medical History  Diagnosis Date  . No pertinent past medical history   . Medical history non-contributory    Past Surgical History  Procedure Laterality Date  . No past surgeries     Family History: family history is not on file. Social History:  reports that she has been smoking Cigarettes.  She has a .5 pack-year smoking history. She has never used smokeless tobacco. She reports that she does not drink alcohol or use illicit drugs.   Prenatal Transfer Tool  Maternal Diabetes: No Genetic Screening: Declined Maternal Ultrasounds/Referrals: Normal Fetal Ultrasounds or other Referrals:  None Maternal Substance Abuse:  No Significant Maternal Medications:  None Significant Maternal Lab Results:  None Other Comments:  GBS positive  Review of Systems  Constitutional: Negative for fever.  Eyes: Negative for blurred vision.  Gastrointestinal: Negative for nausea, vomiting, abdominal pain, diarrhea and constipation.  Genitourinary: Negative for dysuria, urgency and frequency.  Neurological: Negative for headaches.      Last menstrual period 07/04/2012. Maternal Exam:  Uterine Assessment: Contraction frequency is rare.   Introitus: Normal vulva. Pelvis: adequate for delivery.   Cervix: Cervix evaluated by digital exam.     Fetal Exam Fetal Monitor Review: Mode: ultrasound.   Baseline rate: 145.  Variability: moderate (6-25 bpm).    Fetal State Assessment: Category I - tracings are normal.     Physical Exam  Nursing note and vitals reviewed. Constitutional: She is oriented to person, place, and time. She appears well-developed and well-nourished. No distress.  Cardiovascular: Normal  rate.   Respiratory: Effort normal.  GI: Soft. She exhibits no distension. There is no tenderness.  Genitourinary:   Cervix: 2/80/-1  Neurological: She is alert and oriented to person, place, and time.  Skin: Skin is warm and dry.  Psychiatric: She has a normal mood and affect.    Prenatal labs: ABO, Rh: --/--/B POS (10/16 1929) Antibody:   Rubella: 1.27 (04/21 1137) RPR: NON REAC (04/21 1137)  HBsAg: NEGATIVE (04/21 1137)  HIV: NON REACTIVE (04/21 1137)  GBS:   positive  Assessment/Plan: IOL at 41 weeks Reassuring M-F status Pitocin per protocol   Tawnya Crook 03/25/2013, 7:49 AM

## 2013-03-26 MED ORDER — FLEET ENEMA 7-19 GM/118ML RE ENEM: 1.0000 | ENEMA | Freq: Every day | RECTAL | Status: DC | PRN

## 2013-03-26 MED ORDER — FERROUS SULFATE 325 (65 FE) MG PO TABS
325.0000 mg | ORAL_TABLET | Freq: Two times a day (BID) | ORAL | Status: DC
  Administered 2013-03-26 (×2): 325 mg via ORAL
  Filled 2013-03-26 (×3): qty 1

## 2013-03-26 MED ORDER — MEASLES, MUMPS & RUBELLA VAC ~~LOC~~ INJ
0.5000 mL | INJECTION | Freq: Once | SUBCUTANEOUS | Status: DC
  Filled 2013-03-26: qty 0.5

## 2013-03-26 MED ORDER — PRENATAL MULTIVITAMIN CH
1.0000 | ORAL_TABLET | Freq: Every day | ORAL | Status: DC
  Administered 2013-03-26 – 2013-03-27 (×2): 1 via ORAL
  Filled 2013-03-26 (×2): qty 1

## 2013-03-26 MED ORDER — SIMETHICONE 80 MG PO CHEW: 80.0000 mg | CHEWABLE_TABLET | ORAL | Status: DC | PRN

## 2013-03-26 MED ORDER — BISACODYL 10 MG RE SUPP: 10.0000 mg | Freq: Every day | RECTAL | Status: DC | PRN

## 2013-03-26 MED ORDER — LANOLIN HYDROUS EX OINT: TOPICAL_OINTMENT | CUTANEOUS | Status: DC | PRN

## 2013-03-26 MED ORDER — BENZOCAINE-MENTHOL 20-0.5 % EX AERO: 1.0000 "application " | INHALATION_SPRAY | CUTANEOUS | Status: DC | PRN

## 2013-03-26 MED ORDER — TETANUS-DIPHTH-ACELL PERTUSSIS 5-2.5-18.5 LF-MCG/0.5 IM SUSP
0.5000 mL | Freq: Once | INTRAMUSCULAR | Status: AC
  Administered 2013-03-26: 0.5 mL via INTRAMUSCULAR

## 2013-03-26 MED ORDER — SENNOSIDES-DOCUSATE SODIUM 8.6-50 MG PO TABS
2.0000 | ORAL_TABLET | Freq: Every day | ORAL | Status: DC
  Administered 2013-03-26: 2 via ORAL

## 2013-03-26 MED ORDER — PNEUMOCOCCAL VAC POLYVALENT 25 MCG/0.5ML IJ INJ
0.5000 mL | INJECTION | INTRAMUSCULAR | Status: AC
  Administered 2013-03-27: 0.5 mL via INTRAMUSCULAR
  Filled 2013-03-26: qty 0.5

## 2013-03-26 MED ORDER — ONDANSETRON HCL 4 MG PO TABS: 4.0000 mg | ORAL_TABLET | ORAL | Status: DC | PRN

## 2013-03-26 MED ORDER — ONDANSETRON HCL 4 MG/2ML IJ SOLN: 4.0000 mg | INTRAMUSCULAR | Status: DC | PRN

## 2013-03-26 MED ORDER — ZOLPIDEM TARTRATE 5 MG PO TABS: 5.0000 mg | ORAL_TABLET | Freq: Every evening | ORAL | Status: DC | PRN

## 2013-03-26 MED ORDER — OXYCODONE-ACETAMINOPHEN 5-325 MG PO TABS
1.0000 | ORAL_TABLET | ORAL | Status: DC | PRN
  Administered 2013-03-26: 1 via ORAL

## 2013-03-26 MED ORDER — OXYTOCIN 40 UNITS IN LACTATED RINGERS INFUSION - SIMPLE MED: 62.5000 mL/h | INTRAVENOUS | Status: DC | PRN

## 2013-03-26 MED ORDER — DIPHENHYDRAMINE HCL 25 MG PO CAPS: 25.0000 mg | ORAL_CAPSULE | Freq: Four times a day (QID) | ORAL | Status: DC | PRN

## 2013-03-26 MED ORDER — WITCH HAZEL-GLYCERIN EX PADS: 1.0000 "application " | MEDICATED_PAD | CUTANEOUS | Status: DC | PRN

## 2013-03-26 MED ORDER — IBUPROFEN 600 MG PO TABS
600.0000 mg | ORAL_TABLET | Freq: Four times a day (QID) | ORAL | Status: DC
  Administered 2013-03-26 – 2013-03-27 (×6): 600 mg via ORAL
  Filled 2013-03-26 (×6): qty 1

## 2013-03-26 MED ORDER — DIBUCAINE 1 % RE OINT: 1.0000 "application " | TOPICAL_OINTMENT | RECTAL | Status: DC | PRN

## 2013-03-26 NOTE — Progress Notes (Signed)
UR completed 

## 2013-03-26 NOTE — Progress Notes (Signed)
Post Partum Day 1 Subjective: no complaints, up ad lib, voiding and tolerating PO  Objective: Blood pressure 126/81, pulse 62, temperature 98.8 F (37.1 C), temperature source Oral, resp. rate 18, height 5\' 4"  (1.626 m), weight 81.647 kg (180 lb), last menstrual period 07/04/2012, SpO2 98.00%, unknown if currently breastfeeding.  Physical Exam:  General: alert, cooperative and no distress Lochia: appropriate Uterine Fundus: firm Incision: n/a DVT Evaluation: No evidence of DVT seen on physical exam. Negative Homan's sign. No cords or calf tenderness. No significant calf/ankle edema.   Recent Labs  03/25/13 0750  HGB 10.0*  HCT 31.6*    Assessment/Plan: Plan for discharge tomorrow and Contraception unsure Bottle feeding   LOS: 1 day   Napoleon Form 03/26/2013, 7:39 AM

## 2013-03-26 NOTE — Anesthesia Postprocedure Evaluation (Signed)
Anesthesia Post Note  Patient: Kristen Garza  Procedure(s) Performed: * No procedures listed *  Anesthesia type: Epidural  Patient location: Mother/Baby  Post pain: Pain level controlled  Post assessment: Post-op Vital signs reviewed  Last Vitals:  Filed Vitals:   03/26/13 0615  BP: 126/81  Pulse: 62  Temp: 37.1 C  Resp: 18    Post vital signs: Reviewed  Level of consciousness: awake  Complications: No apparent anesthesia complications

## 2013-03-27 MED ORDER — OXYCODONE-ACETAMINOPHEN 5-325 MG PO TABS: 1.0000 | ORAL_TABLET | ORAL | Status: DC | PRN

## 2013-03-27 MED ORDER — IBUPROFEN 600 MG PO TABS: 600.0000 mg | ORAL_TABLET | Freq: Four times a day (QID) | ORAL | Status: DC

## 2013-03-27 NOTE — Discharge Summary (Signed)
Obstetric Discharge Summary Reason for Admission: induction of labor and for post dates Prenatal Procedures: none Intrapartum Procedures: spontaneous vaginal delivery Postpartum Procedures: none Complications-Operative and Postpartum: 2nd degree degree perineal laceration Hemoglobin  Date Value Range Status  03/25/2013 10.0* 12.0 - 15.0 g/dL Final     HCT  Date Value Range Status  03/25/2013 31.6* 36.0 - 46.0 % Final    Physical Exam:  General: alert, cooperative, appears stated age and no distress Lochia: appropriate Uterine Fundus: firm Incision: healing well DVT Evaluation: No evidence of DVT seen on physical exam. Negative Homan's sign. No cords or calf tenderness.  Discharge Diagnoses: Term Pregnancy-delivered  Discharge Information: Date: 03/27/2013 Activity: pelvic rest Diet: routine Medications: PNV and Ibuprofen Condition: stable Instructions: refer to practice specific booklet Discharge to: home Follow-up Information   Follow up with [facility]. Schedule an appointment as soon as possible for a visit in 6 weeks.   Contact information:   [address] [place] [address] [phone]      Newborn Data: Live born female  Birth Weight: 7 lb 3 oz (3260 g) APGAR: 9, 9  Home with mother.   Andrena Mews, DO [facility] Resident - PGY-2 03/27/2013 8:25 AM

## 2013-03-27 NOTE — Progress Notes (Signed)
CSW attempted to meet with pt to assess reason for NPNC. Pt appeared to be tired & uninterested in speaking with this worker. She told CSW that she tried to apply for Medicaid but never received benefits. She denies any illegal substance use. UDS is negative, meconium results are pending. FOB, Kristen Garza was at the bedside asleep. Infants name is Kristen Garza, Jr. CSW unable to complete full assessment due to lack of engagement by client.      

## 2013-04-02 NOTE — Discharge Summary (Signed)
Attestation of Attending Supervision of Advanced Practitioner (CNM/NP): Evaluation and management procedures were performed by the Advanced Practitioner under my supervision and collaboration.  I have reviewed the Advanced Practitioner's note and chart, and I agree with the management and plan.  Xaine Sansom 04/02/2013 9:44 AM   

## 2013-04-24 ENCOUNTER — Ambulatory Visit: Payer: Self-pay | Admitting: Obstetrics and Gynecology

## 2013-10-03 ENCOUNTER — Other Ambulatory Visit: Payer: Self-pay

## 2014-03-15 ENCOUNTER — Emergency Department (HOSPITAL_COMMUNITY)
Admission: EM | Admit: 2014-03-15 | Discharge: 2014-03-15 | Disposition: A | Discharge status: Home / Self Care | Payer: Medicaid Other | Attending: Emergency Medicine | Admitting: Emergency Medicine

## 2014-03-15 DIAGNOSIS — J020 Streptococcal pharyngitis: Secondary | ICD-10-CM | POA: Insufficient documentation

## 2014-03-15 DIAGNOSIS — E860 Dehydration: Secondary | ICD-10-CM | POA: Insufficient documentation

## 2014-03-15 DIAGNOSIS — F17200 Nicotine dependence, unspecified, uncomplicated: Secondary | ICD-10-CM | POA: Insufficient documentation

## 2014-03-15 LAB — RAPID STREP SCREEN (MED CTR MEBANE ONLY): STREPTOCOCCUS, GROUP A SCREEN (DIRECT): POSITIVE — AB

## 2014-03-15 MED ORDER — PENICILLIN G BENZATHINE 1200000 UNIT/2ML IM SUSP
1.2000 10*6.[IU] | Freq: Once | INTRAMUSCULAR | Status: AC
  Administered 2014-03-15: 1.2 10*6.[IU] via INTRAMUSCULAR
  Filled 2014-03-15: qty 2

## 2014-03-15 MED ORDER — ACETAMINOPHEN 325 MG PO TABS: 650.0000 mg | ORAL_TABLET | Freq: Once | ORAL | Status: DC

## 2014-03-15 MED ORDER — ACETAMINOPHEN 500 MG PO TABS
1000.0000 mg | ORAL_TABLET | Freq: Once | ORAL | Status: AC
  Administered 2014-03-15: 1000 mg via ORAL
  Filled 2014-03-15: qty 2

## 2014-03-15 MED ORDER — PREDNISOLONE SODIUM PHOSPHATE 15 MG/5ML PO SOLN: 15.0000 mg | Freq: Every day | ORAL | Status: AC

## 2014-03-15 NOTE — ED Notes (Signed)
Pt tearfully reports sore throat, cough and headache since yesterday. Pt has taken OTC medications without relief. Pt denies n/v/d, Pt A&Ox4, respirations equal and unlabored, skin warm and dry.

## 2014-03-15 NOTE — Discharge Instructions (Signed)
Strep Throat  Strep throat is an infection of the throat caused by a bacteria named Streptococcus pyogenes. Your caregiver may call the infection streptococcal "tonsillitis" or "pharyngitis" depending on whether there are signs of inflammation in the tonsils or back of the throat. Strep throat is most common in children aged 25 15 years during the cold months of the year, but it can occur in people of any age during any season. This infection is spread from person to person (contagious) through coughing, sneezing, or other close contact.  SYMPTOMS   · Fever or chills.  · Painful, swollen, red tonsils or throat.  · Pain or difficulty when swallowing.  · White or yellow spots on the tonsils or throat.  · Swollen, tender lymph nodes or "glands" of the neck or under the jaw.  · Red rash all over the body (rare).  DIAGNOSIS   Many different infections can cause the same symptoms. A test must be done to confirm the diagnosis so the right treatment can be given. A "rapid strep test" can help your caregiver make the diagnosis in a few minutes. If this test is not available, a light swab of the infected area can be used for a throat culture test. If a throat culture test is done, results are usually available in a day or two.  TREATMENT   Strep throat is treated with antibiotic medicine.  HOME CARE INSTRUCTIONS   · Gargle with 1 tsp of salt in 1 cup of warm water, 3 4 times per day or as needed for comfort.  · Family members who also have a sore throat or fever should be tested for strep throat and treated with antibiotics if they have the strep infection.  · Make sure everyone in your household washes their hands well.  · Do not share food, drinking cups, or personal items that could cause the infection to spread to others.  · You may need to eat a soft food diet until your sore throat gets better.  · Drink enough water and fluids to keep your urine clear or pale yellow. This will help prevent dehydration.  · Get plenty of  rest.  · Stay home from school, daycare, or work until you have been on antibiotics for 24 hours.  · Only take over-the-counter or prescription medicines for pain, discomfort, or fever as directed by your caregiver.  · If antibiotics are prescribed, take them as directed. Finish them even if you start to feel better.  SEEK MEDICAL CARE IF:   · The glands in your neck continue to enlarge.  · You develop a rash, cough, or earache.  · You cough up green, yellow-brown, or bloody sputum.  · You have pain or discomfort not controlled by medicines.  · Your problems seem to be getting worse rather than better.  SEEK IMMEDIATE MEDICAL CARE IF:   · You develop any new symptoms such as vomiting, severe headache, stiff or painful neck, chest pain, shortness of breath, or trouble swallowing.  · You develop severe throat pain, drooling, or changes in your voice.  · You develop swelling of the neck, or the skin on the neck becomes red and tender.  · You have a fever.  · You develop signs of dehydration, such as fatigue, dry mouth, and decreased urination.  · You become increasingly sleepy, or you cannot wake up completely.  Document Released: 11/11/2000 Document Revised: 10/31/2012 Document Reviewed: 01/13/2011  Exit[employer]® Patient Information ©2014 [employer].

## 2014-03-15 NOTE — ED Notes (Signed)
Pt st's she started getting sick yesterday with sore throat, fever and headache.

## 2014-03-15 NOTE — ED Provider Notes (Signed)
CSN: [id number]     Arrival date & time 03/15/14  2115 History  This chart was scribed for non-physician practitioner working with Hilario Quarryanielle S Ray, MD by Elveria Risingimelie Horne, ED Scribe. This patient was seen in room TR06C/TR06C and the patient's care was started at 11:17 PM.   Chief Complaint  Patient presents with  . Sore Throat  . Headache      The history is provided by the patient. No language interpreter was used.   HPI Comments: Kristen Garza is a 23 y.o. female who presents to the Emergency Department complaining of cold symptoms including, sore throat, cough, headache, onset yesterday. Patient reports taking OTC medications, without relief. She reports having a fever.  The symptoms are aggravated by swallowing.  There are no alleviating factors.  She denies sick contacts.  Past Medical History  Diagnosis Date  . No pertinent past medical history   . Medical history non-contributory    Past Surgical History  Procedure Laterality Date  . No past surgeries     No family history on file. History  Substance Use Topics  . Smoking status: Current Some Day Smoker -- 0.25 packs/day for 2 years    Types: Cigarettes  . Smokeless tobacco: Never Used  . Alcohol Use: No   OB History   Grav Para Term Preterm Abortions TAB SAB Ect Mult Living   2 2 2       2      Review of Systems  Constitutional: Positive for fever and chills.  HENT: Positive for sore throat. Negative for drooling and postnasal drip.   Respiratory: Positive for cough. Negative for shortness of breath.   Gastrointestinal: Negative for nausea, vomiting, diarrhea and constipation.  Neurological: Positive for headaches.      Allergies  Review of patient's allergies indicates no known allergies.  Home Medications   Prior to Admission medications   Medication Sig Start Date End Date Taking? Authorizing Provider  ibuprofen (ADVIL,MOTRIN) 600 MG tablet Take 1 tablet (600 mg total) by mouth every 6 (six) hours.  03/27/13   Andrena MewsMichael D Rigby, DO  oxyCODONE-acetaminophen (PERCOCET/ROXICET) 5-325 MG per tablet Take 1-2 tablets by mouth every 4 (four) hours as needed. 03/27/13   Latrelle DodrillBrittany J McIntyre, MD  Prenatal Vit-Fe Fumarate-FA (PRENATAL MULTIVITAMIN) TABS Take 1 tablet by mouth daily at 12 noon.    Historical Provider, MD   Triage Vitals: BP 136/85  Pulse 106  Temp(Src) 103.2 F (39.6 C) (Oral)  Resp 18  Ht 5\' 4"  (1.626 m)  Wt 196 lb (88.905 kg)  BMI 33.63 kg/m2  SpO2 98%  LMP 02/09/2014 Physical Exam  Nursing note and vitals reviewed. Constitutional: She is oriented to person, place, and time. She appears well-developed and well-nourished. No distress.  HENT:  Head: Normocephalic and atraumatic.  Oropharynx is erythematous, no exudates, no abscess, uvula is midline, airway is intact, no stridor or drooling  Eyes: Conjunctivae and EOM are normal.  Neck: Normal range of motion. Neck supple. No tracheal deviation present.  No meningeal signs  Cardiovascular: Normal rate.   Pulmonary/Chest: Effort normal. No stridor. No respiratory distress.  Abdominal: She exhibits no distension.  Musculoskeletal: Normal range of motion.  Neurological: She is alert and oriented to person, place, and time.  Skin: Skin is warm and dry.  Psychiatric: She has a normal mood and affect. Her behavior is normal. Judgment and thought content normal.    ED Course  Procedures (including critical care time) DIAGNOSTIC STUDIES: Oxygen Saturation is 98%  on room air, normal by my interpretation.    COORDINATION OF CARE: 11:17 PM- Discussed treatment plan with patient at bedside and patient agreed to plan.      Labs Review Labs Reviewed  RAPID STREP SCREEN - Abnormal; Notable for the following:    Streptococcus, Group A Screen (Direct) POSITIVE (*)    All other components within normal limits    Imaging Review No results found.   EKG Interpretation None      MDM   Final diagnoses:  Strep throat     Pt febrile with tonsillar exudate, cervical lymphadenopathy, & dysphagia; diagnosis of strep. Treated in the Ed with steroids, NSAIDs, Pain medication and PCN IM.  Pt appears mildly dehydrated, discussed importance of water rehydration. Presentation non concerning for PTA or infxn spread to soft tissue. No trismus or uvula deviation. Specific return precautions discussed. Pt able to drink water in ED without difficulty with intact air way. Recommended PCP follow up.   Filed Vitals:   03/15/14 2315  BP: 112/78  Pulse: 90  Temp: 99.7 F (37.6 C)  Resp: 18     I personally performed the services described in this documentation, which was scribed in my presence. The recorded information has been reviewed and is accurate.     Roxy Horsemanobert Jailan Trimm, PA-C 03/15/14 2358

## 2014-03-16 NOTE — ED Provider Notes (Signed)
History/physical exam/procedure(s) were performed by non-physician practitioner and as supervising physician I was immediately available for consultation/collaboration. I have reviewed all notes and am in agreement with care and plan.   Hilario Quarryanielle S Henson Fraticelli, MD 03/16/14 2000

## 2014-03-23 IMAGING — US US FETAL BPP W/O NONSTRESS
1 series · 12 of 28 positions shown · non-contrast
Comparison: none

[Series 1: us ob follow up · 12 of 41 slices shown]
[im 2/41]
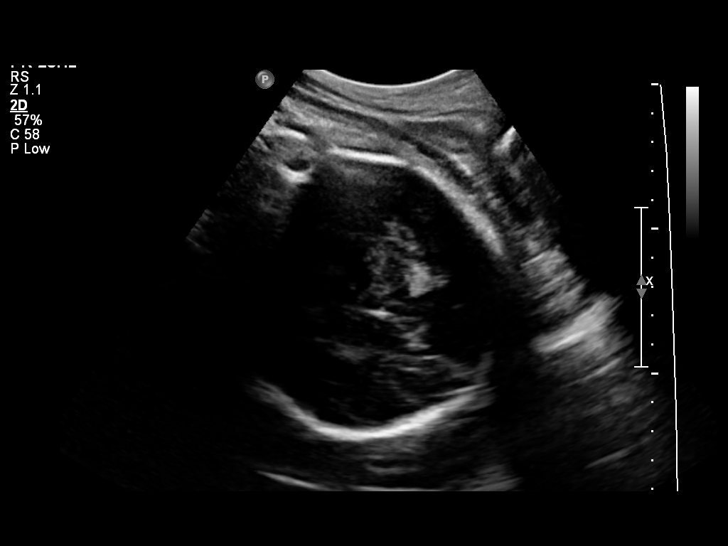
[im 5/41]
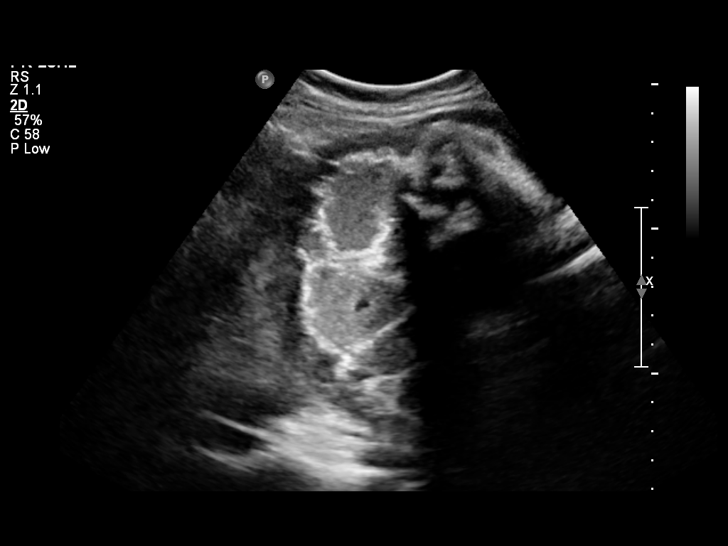
[im 8/41]
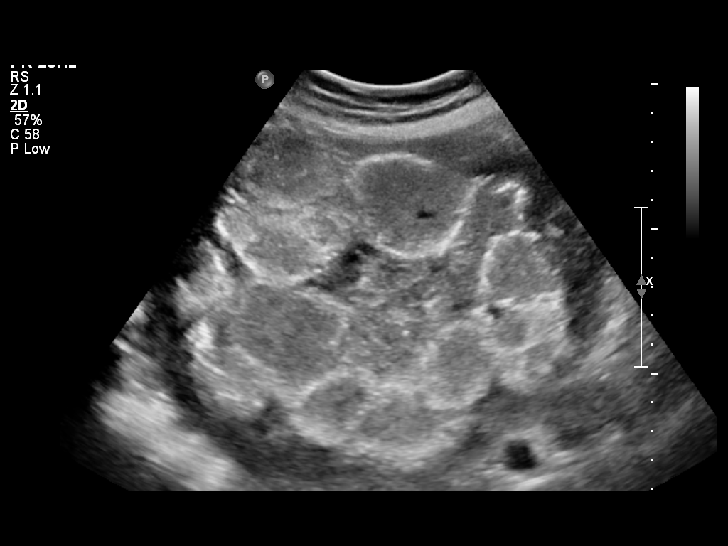
[im 12/41]
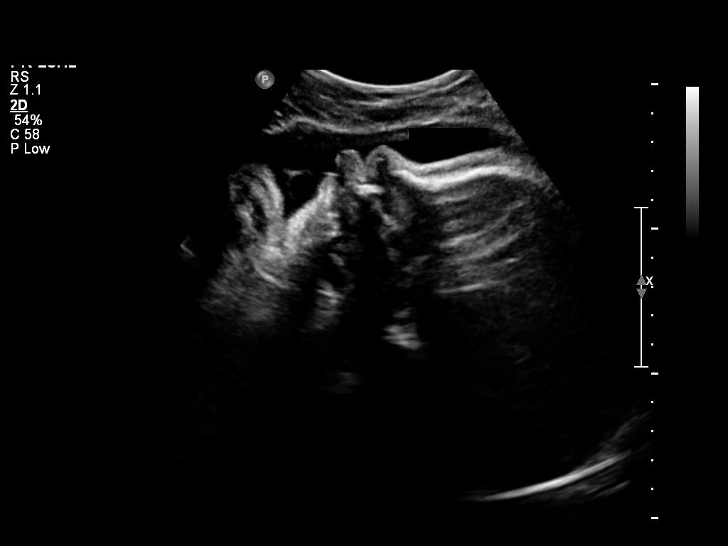
[im 15/41]
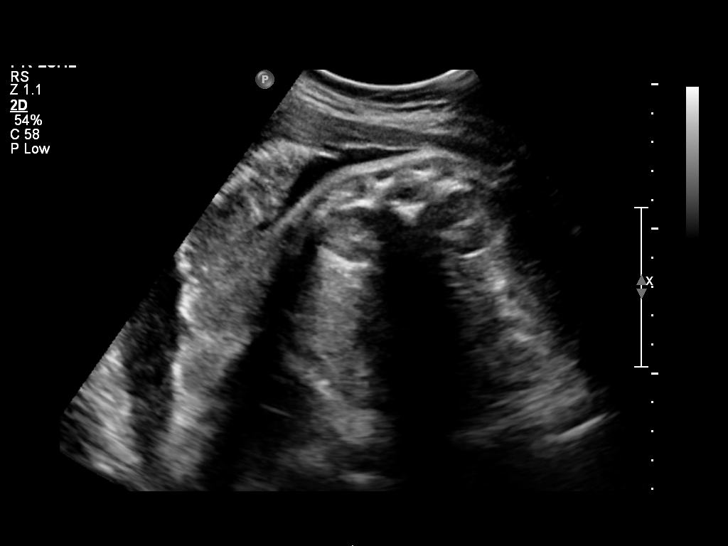
[im 18/41]
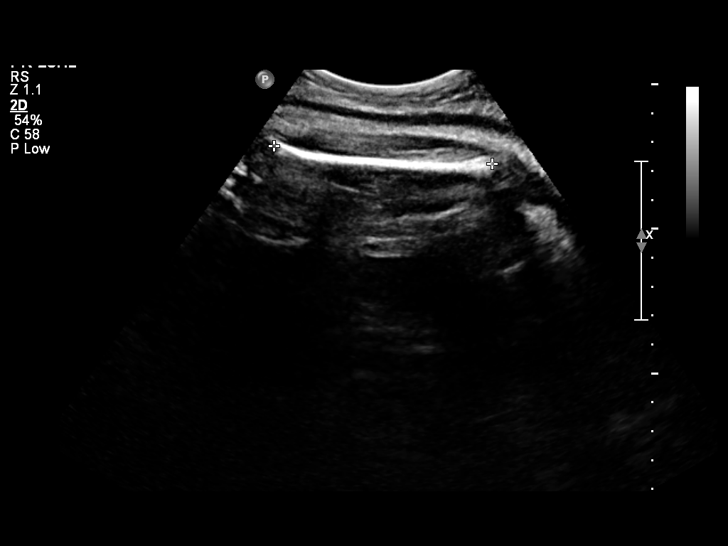
[im 23/41]
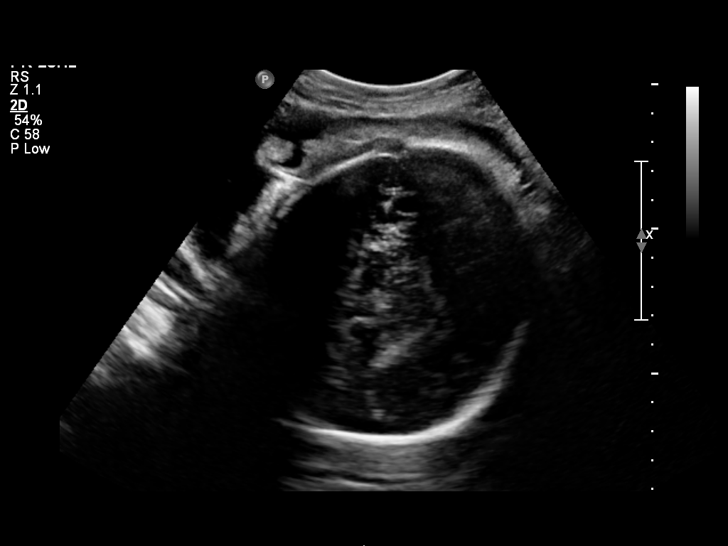
[im 26/41]
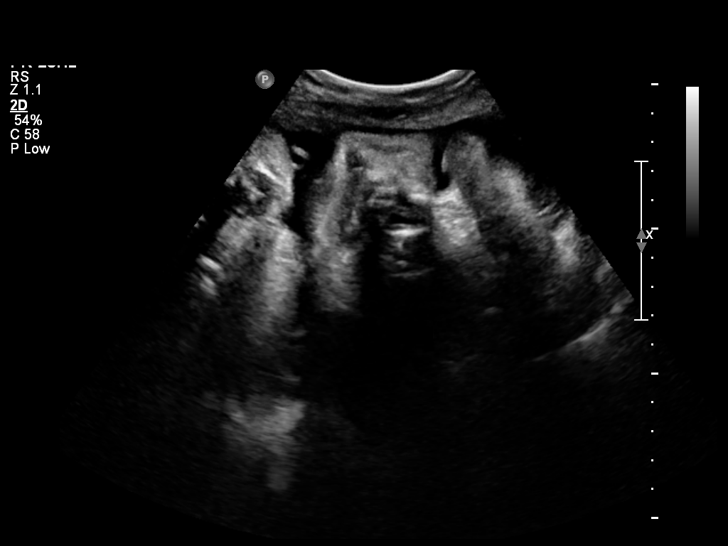
[im 29/41]
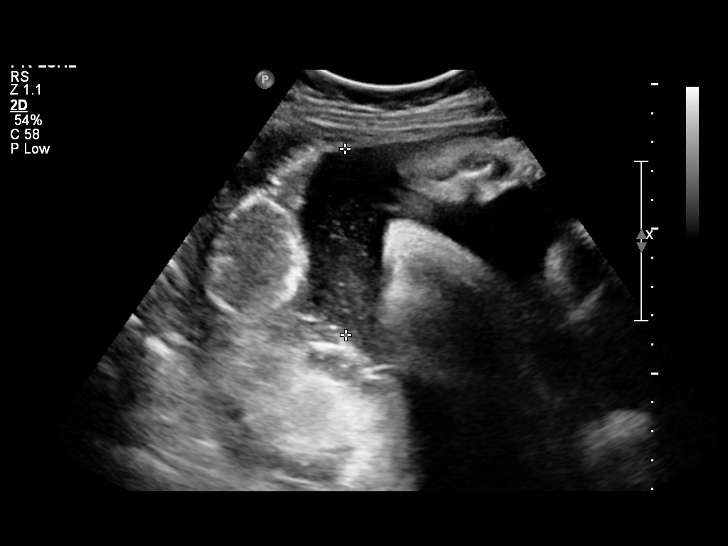
[im 33/41]
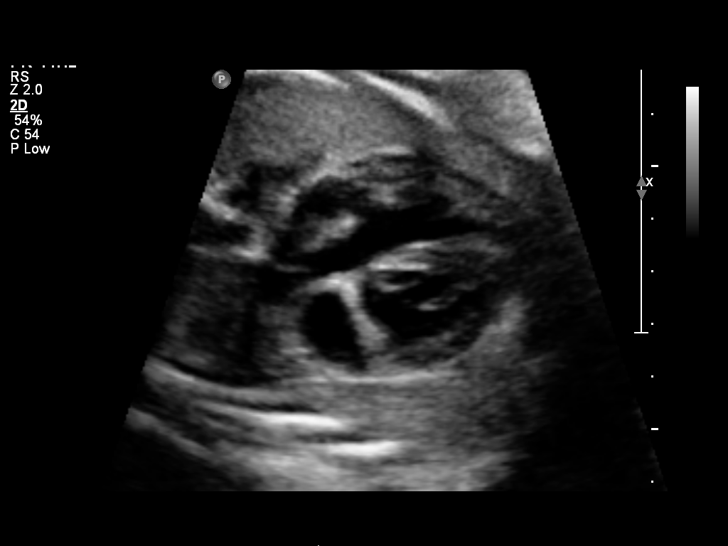
[im 36/41]
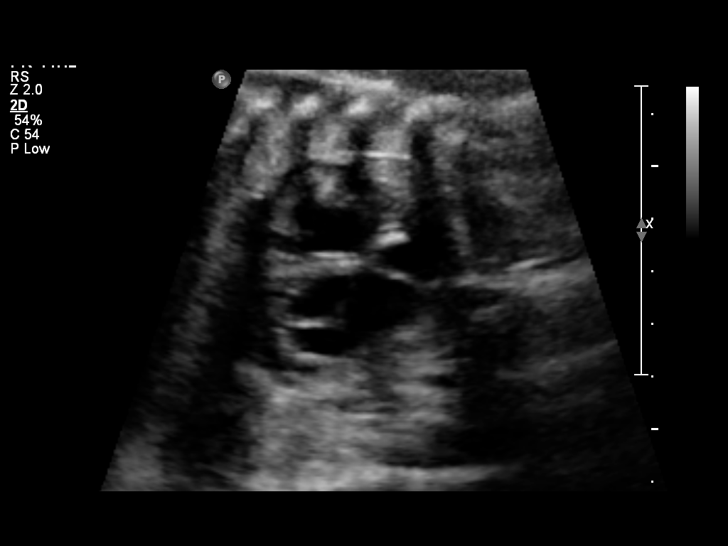
[im 39/41]
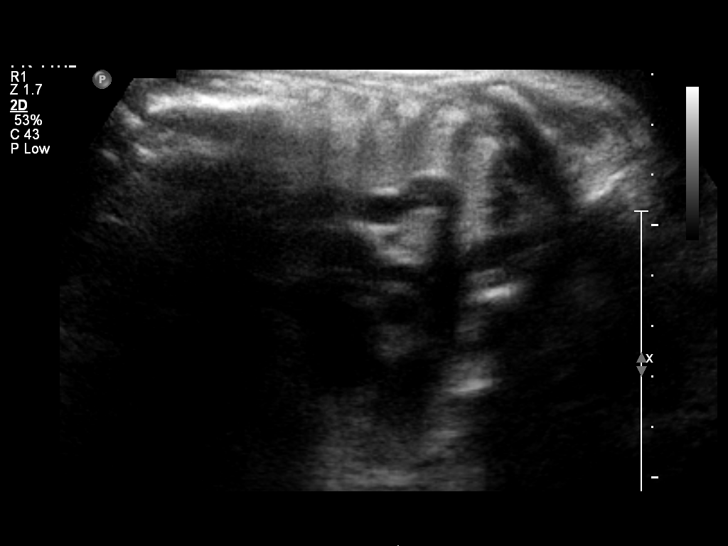

[12 of 28 positions shown; findings below may reference images not displayed]

OBSTETRICS REPORT
                      (Signed Final 03/21/2013 [DATE])

 Name:       PAVLOV DEFAZIO             Visit Date:  03/21/2013 [DATE]

Service(s) Provided

 US OB FOLLOW UP                                       76816.1
Indications

 Size less than dates (Small for gestational [AGE]
 FGR)
 Postdate pregnancy (40-42 weeks)
Fetal Evaluation

 Num Of Fetuses:    1
 Fetal Heart Rate:  139                         bpm
 Cardiac Activity:  Observed
 Presentation:      Cephalic
 Placenta:          Posterior Fundal, above
                    cervical os

 Amniotic Fluid
 AFI FV:      Subjectively within normal limits
 AFI Sum:     17.91   cm      76   %Tile     Larg Pckt:   6.43   cm
 RUQ:   5.27   cm    LUQ:    6.43   cm    LLQ:   6.21    cm
Biophysical Evaluation

 Amniotic F.V:   Within normal limits       F. Tone:        Observed
 F. Movement:    Observed                   Score:          [DATE]
 F. Breathing:   Observed
Biometry

 BPD:     96.9  mm    G. Age:   39w 5d                CI:        78.15   70 - 86
                                                      FL/HC:      22.0   20.7 -

 HC:     346.8  mm    G. Age:   40w 1d       58  %    HC/AC:      1.03   0.87 -

 AC:     335.1  mm    G. Age:   37w 3d       18  %    FL/BPD:     78.8   71 - 87
 FL:      76.4  mm    G. Age:   39w 1d       47  %    FL/AC:      22.8   20 - 24

 Est. FW:    1728  gm    7 lb 11 oz      70  %
Gestational Age
 LMP:           37w 1d       Date:   07/04/12                 EDD:   04/10/13
 U/S Today:     39w 1d                                        EDD:   03/27/13
 Best:          39w 4d    Det. By:   Early Ultrasound         EDD:   03/24/13
                                     (09/12/12)
Anatomy

 Cranium:          Appears normal         Aortic Arch:      Previously seen
 Fetal Cavum:      Previously seen        Ductal Arch:      Appears normal
 Ventricles:       Appears normal         Diaphragm:        Appears normal
 Choroid Plexus:   Appears normal         Stomach:          Appears normal
 Cerebellum:       Previously seen        Abdomen:          Appears normal
 Posterior Fossa:  Previously seen        Abdominal Wall:   Previously seen
 Nuchal Fold:      Previously seen        Cord Vessels:     Appears normal (3
                                                            vessel cord)
 Face:             Appears normal         Kidneys:          Appear normal
                   (orbits and profile)
 Lips:             Appears normal         Bladder:          Appears normal
 Heart:            Appears normal         Spine:            Previously seen
                   (4CH, axis, and
                   situs)
 RVOT:             Appears normal         Lower             Previously seen
                                          Extremities:
 LVOT:             Appears normal         Upper             Previously seen
                                          Extremities:

 Other:  Male gender.
Cervix Uterus Adnexa

 Cervix:       Not visualized (advanced GA >34 wks)
 Left Ovary:   Not visualized.
 Right Ovary:  Previously seen
Impression

 Single intrauterine gestation demonstrating an estimated
 gestational age by ultrasound of 39w 1d. This is correlated
 with expected estimated gestational age by early ultrasound
 of 39w 4d. EFW is currently at the 70%.

 No late developing fetal anatomic abnormalities are noted
 associated with the lateral ventricles, four chamber heart,
 stomach, kidneys or bladder.

 BPP [DATE].

 Subjectively and quantiatively normal amniotic fluid volume
 with a single pocket measuring > 2 x 2 cm.

## 2014-04-15 ENCOUNTER — Inpatient Hospital Stay (HOSPITAL_COMMUNITY)
Admission: AD | Admit: 2014-04-15 | Discharge: 2014-04-15 | Discharge status: Against Medical Advice | Payer: Medicaid Other | Source: Ambulatory Visit | Attending: Family Medicine | Admitting: Family Medicine

## 2014-04-15 ENCOUNTER — Encounter (HOSPITAL_COMMUNITY): Payer: Self-pay | Admitting: Radiology

## 2014-04-15 ENCOUNTER — Inpatient Hospital Stay (HOSPITAL_COMMUNITY): Payer: Medicaid Other

## 2014-04-15 DIAGNOSIS — O9989 Other specified diseases and conditions complicating pregnancy, childbirth and the puerperium: Principal | ICD-10-CM

## 2014-04-15 DIAGNOSIS — R109 Unspecified abdominal pain: Secondary | ICD-10-CM | POA: Insufficient documentation

## 2014-04-15 DIAGNOSIS — O99891 Other specified diseases and conditions complicating pregnancy: Secondary | ICD-10-CM | POA: Insufficient documentation

## 2014-04-15 LAB — CBC
HEMATOCRIT: 34.9 % — AB (ref 36.0–46.0)
Hemoglobin: 11.3 g/dL — ABNORMAL LOW (ref 12.0–15.0)
MCH: 24.8 pg — ABNORMAL LOW (ref 26.0–34.0)
MCHC: 32.4 g/dL (ref 30.0–36.0)
MCV: 76.7 fL — AB (ref 78.0–100.0)
PLATELETS: 201 10*3/uL (ref 150–400)
RBC: 4.55 MIL/uL (ref 3.87–5.11)
RDW: 14.3 % (ref 11.5–15.5)
WBC: 5.9 10*3/uL (ref 4.0–10.5)

## 2014-04-15 LAB — URINALYSIS, ROUTINE W REFLEX MICROSCOPIC
BILIRUBIN URINE: NEGATIVE
Glucose, UA: NEGATIVE mg/dL
HGB URINE DIPSTICK: NEGATIVE
Ketones, ur: NEGATIVE mg/dL
Leukocytes, UA: NEGATIVE
Nitrite: NEGATIVE
PH: 6 (ref 5.0–8.0)
Protein, ur: NEGATIVE mg/dL
Specific Gravity, Urine: >1.03 — ABNORMAL HIGH (ref 1.005–1.030)
Urobilinogen, UA: 1 mg/dL (ref 0.0–1.0)

## 2014-04-15 LAB — HCG, QUANTITATIVE, PREGNANCY: hCG, Beta Chain, Quant, S: 53516 m[IU]/mL — ABNORMAL HIGH (ref <5)

## 2014-04-15 LAB — POCT PREGNANCY, URINE: Preg Test, Ur: POSITIVE — AB

## 2014-04-15 NOTE — MAU Note (Signed)
Not in lobby

## 2014-04-15 NOTE — MAU Note (Signed)
Patient not in lobby

## 2014-04-15 NOTE — MAU Provider Note (Signed)
+  UPT in MAU today. Patient told RN she was having lower abdominal pain. Labs and US ordered while patient was in the lobby. Upon return from US patient was not in the lobby when called to a room for evaluation by provider.   Results for orders placed during the hospital encounter of 04/15/14 (from the past 24 hour(s))  URINALYSIS, ROUTINE W REFLEX MICROSCOPIC     Status: Abnormal   Collection Time    04/15/14  7:46 PM      Result Value Ref Range   Color, Urine YELLOW  YELLOW   APPearance CLEAR  CLEAR   Specific Gravity, Urine >1.030 (*) 1.005 - 1.030   pH 6.0  5.0 - 8.0   Glucose, UA NEGATIVE  NEGATIVE mg/dL   Hgb urine dipstick NEGATIVE  NEGATIVE   Bilirubin Urine NEGATIVE  NEGATIVE   Ketones, ur NEGATIVE  NEGATIVE mg/dL   Protein, ur NEGATIVE  NEGATIVE mg/dL   Urobilinogen, UA 1.0  0.0 - 1.0 mg/dL   Nitrite NEGATIVE  NEGATIVE   Leukocytes, UA NEGATIVE  NEGATIVE  POCT PREGNANCY, URINE     Status: Abnormal   Collection Time    04/15/14  8:11 PM      Result Value Ref Range   Preg Test, Ur POSITIVE (*) NEGATIVE  CBC     Status: Abnormal   Collection Time    04/15/14  8:29 PM      Result Value Ref Range   WBC 5.9  4.0 - 10.5 K/uL   RBC 4.55  3.87 - 5.11 MIL/uL   Hemoglobin 11.3 (*) 12.0 - 15.0 g/dL   HCT 41.634.9 (*) 60.636.0 - 30.146.0 %   MCV 76.7 (*) 78.0 - 100.0 fL   MCH 24.8 (*) 26.0 - 34.0 pg   MCHC 32.4  30.0 - 36.0 g/dL   RDW 60.114.3  09.311.5 - 23.515.5 %   Platelets 201  150 - 400 K/uL  HCG, QUANTITATIVE, PREGNANCY     Status: Abnormal   Collection Time    04/15/14  8:29 PM      Result Value Ref Range   hCG, Beta Chain, Quant, S 53516 (*) <5 mIU/mL    [place] Ob Comp Less 14 Wks  04/15/2014   CLINICAL DATA:  Early pregnancy.  Cramping abdominal pain.  EXAM: OBSTETRIC <14 WK ULTRASOUND  TECHNIQUE: Transabdominal ultrasound was performed for evaluation of the gestation as well as the maternal uterus and adnexal regions.  COMPARISON:  None.  FINDINGS: Intrauterine gestational sac: Single  Yolk  sac:  Yes  Embryo:  Yes  Cardiac Activity: Yes  Heart Rate: 160 bpm  CRL:   15.5  mm   8 w 0 d                  US EDC: 11/25/2014  Maternal uterus/adnexae: No subchorionic hemorrhage. Normal right ovary. 1.4 cm corpus luteum cyst on the otherwise normal left ovary. No free fluid.  IMPRESSION: Normal appearing single intrauterine pregnancy approximately 8 weeks 0 days gestational.   Electronically Signed   By: Geanie CooleyJim  Maxwell M.D.   On: 04/15/2014 21:55    A: SIUP at 310w0d with normal cardiac activity Abdominal pain in pregnancy, antepartum  P: Patient left AMA prior to exam or results  Freddi StarrJulie N Ethier, PA-C 04/15/2014 11:17 PM

## 2014-04-15 NOTE — MAU Note (Signed)
Pt reports cramping and states she needs to know her exact due date and how many weeks she is. Pt had positive home preg test

## 2014-04-16 NOTE — MAU Provider Note (Signed)
Attestation of Attending Supervision of Advanced Practitioner (PA/CNM/NP): Evaluation and management procedures were performed by the Advanced Practitioner under my supervision and collaboration.  I have reviewed the Advanced Practitioner's note and chart, and I agree with the management and plan.  Reva Boresanya S Pratt, MD [facility] Attending 04/16/2014 6:30 AM

## 2014-09-29 ENCOUNTER — Encounter (HOSPITAL_COMMUNITY): Payer: Self-pay | Admitting: Radiology

## 2014-11-05 ENCOUNTER — Emergency Department (HOSPITAL_COMMUNITY)
Admission: EM | Admit: 2014-11-05 | Discharge: 2014-11-05 | Disposition: A | Discharge status: Home / Self Care | Payer: Medicaid Other | Attending: Emergency Medicine | Admitting: Emergency Medicine

## 2014-11-05 ENCOUNTER — Encounter (HOSPITAL_COMMUNITY): Payer: Self-pay | Admitting: *Deleted

## 2014-11-05 DIAGNOSIS — K0889 Other specified disorders of teeth and supporting structures: Secondary | ICD-10-CM

## 2014-11-05 DIAGNOSIS — M62830 Muscle spasm of back: Secondary | ICD-10-CM

## 2014-11-05 DIAGNOSIS — Y92410 Unspecified street and highway as the place of occurrence of the external cause: Secondary | ICD-10-CM | POA: Insufficient documentation

## 2014-11-05 DIAGNOSIS — S299XXA Unspecified injury of thorax, initial encounter: Secondary | ICD-10-CM | POA: Insufficient documentation

## 2014-11-05 DIAGNOSIS — M7071 Other bursitis of hip, right hip: Secondary | ICD-10-CM | POA: Insufficient documentation

## 2014-11-05 DIAGNOSIS — K088 Other specified disorders of teeth and supporting structures: Secondary | ICD-10-CM | POA: Insufficient documentation

## 2014-11-05 DIAGNOSIS — M546 Pain in thoracic spine: Secondary | ICD-10-CM

## 2014-11-05 DIAGNOSIS — K029 Dental caries, unspecified: Secondary | ICD-10-CM | POA: Insufficient documentation

## 2014-11-05 DIAGNOSIS — Z720 Tobacco use: Secondary | ICD-10-CM | POA: Insufficient documentation

## 2014-11-05 DIAGNOSIS — Y998 Other external cause status: Secondary | ICD-10-CM | POA: Insufficient documentation

## 2014-11-05 DIAGNOSIS — Y9389 Activity, other specified: Secondary | ICD-10-CM | POA: Insufficient documentation

## 2014-11-05 MED ORDER — AMOXICILLIN-POT CLAVULANATE 875-125 MG PO TABS: 1.0000 | ORAL_TABLET | Freq: Two times a day (BID) | ORAL | Status: DC

## 2014-11-05 MED ORDER — NAPROXEN 500 MG PO TABS: 500.0000 mg | ORAL_TABLET | Freq: Two times a day (BID) | ORAL | Status: DC | PRN

## 2014-11-05 MED ORDER — CYCLOBENZAPRINE HCL 10 MG PO TABS: 10.0000 mg | ORAL_TABLET | Freq: Three times a day (TID) | ORAL | Status: DC | PRN

## 2014-11-05 MED ORDER — HYDROCODONE-ACETAMINOPHEN 5-325 MG PO TABS
1.0000 | ORAL_TABLET | Freq: Once | ORAL | Status: AC
  Administered 2014-11-05: 1 via ORAL
  Filled 2014-11-05: qty 1

## 2014-11-05 MED ORDER — HYDROCODONE-ACETAMINOPHEN 5-325 MG PO TABS: 1.0000 | ORAL_TABLET | Freq: Four times a day (QID) | ORAL | Status: DC | PRN
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
## 2014-11-05 NOTE — Discharge Instructions (Signed)
Take naprosyn as directed for inflammation and pain with vicodin for breakthrough pain and flexeril for muscle relaxation. Do not drive or operate machinery with pain medication or muscle relaxation use. Ice to areas of soreness for the next few days and then may move to heat, no more than 20 minutes at a time for each. Expect to be sore for the next few day and follow up with primary care physician for recheck of ongoing symptoms. For your dental pain, apply warm compresses to jaw throughout the day. Start taking the antibiotic if your tooth develops worsening swelling or drainage, or if you develop fevers. Stop smoking. Followup with a dentist is very important for ongoing evaluation and management of recurrent dental pain. Return to emergency department for emergent changing or worsening symptoms.   Back Pain, Adult Back pain is very common. The pain often gets better over time. The cause of back pain is usually not dangerous. Most people can learn to manage their back pain on their own.  HOME CARE   Stay active. Start with short walks on flat ground if you can. Try to walk farther each day.  Do not sit, drive, or stand in one place for more than 30 minutes. Do not stay in bed.  Do not avoid exercise or work. Activity can help your back heal faster.  Be careful when you bend or lift an object. Bend at your knees, keep the object close to you, and do not twist.  Sleep on a firm mattress. Lie on your side, and bend your knees. If you lie on your back, put a pillow under your knees.  Only take medicines as told by your doctor.  Put ice on the injured area.  Put ice in a plastic bag.  Place a towel between your skin and the bag.  Leave the ice on for 15-20 minutes, 03-04 times a day for the first 2 to 3 days. After that, you can switch between ice and heat packs.  Ask your doctor about back exercises or massage.  Avoid feeling anxious or stressed. Find good ways to deal with stress, such  as exercise. GET HELP RIGHT AWAY IF:   Your pain does not go away with rest or medicine.  Your pain does not go away in 1 week.  You have new problems.  You do not feel well.  The pain spreads into your legs.  You cannot control when you poop (bowel movement) or pee (urinate).  Your arms or legs feel weak or lose feeling (numbness).  You feel sick to your stomach (nauseous) or throw up (vomit).  You have belly (abdominal) pain.  You feel like you may pass out (faint). MAKE SURE YOU:   Understand these instructions.  Will watch your condition.  Will get help right away if you are not doing well or get worse. Document Released: 05/02/2008 Document Revised: 02/06/2012 Document Reviewed: 03/18/2014 [facility] Patient Information 2015 [place], [place]. This information is not intended to replace advice given to you by your health care provider. Make sure you discuss any questions you have with your health care provider.  Hip Bursitis Bursitis is a puffiness (swelling) and soreness of a fluid-filled sac (bursa). This sac covers and protects the joint. HOME CARE  Put ice on the injured area.  Put ice in a plastic bag.  Place a towel between your skin and the bag.  Leave the ice on for 15-20 minutes, 03-04 times a day.  Rest the painful joint  as much as possible. Move your joint at least 4 times a day. When pain lessens, start normal, slow movements and normal activities.  Only take medicine as told by your doctor.  Use crutches as told.  Raise (elevate) your painful joint. Use pillows for propping your legs and hips.  Get a massage to lessen pain. GET HELP RIGHT AWAY IF:  Your pain increases or does not improve during treatment.  You have a fever.  You feel heat coming from the affected area.  You see redness and puffiness around the affected area.  You have any questions or concerns. MAKE SURE YOU:  Understand these instructions.  Will watch your  condition.  Will get help right away if you are not well or get worse. Document Released: 12/17/2010 Document Revised: 02/06/2012 Document Reviewed: 12/17/2010 [facility] Patient Information 2015 [place], [place]. This information is not intended to replace advice given to you by your health care provider. Make sure you discuss any questions you have with your health care provider.  Dental Pain Toothache is pain in or around a tooth. It may get worse with chewing or with cold or heat.  HOME CARE  Your dentist may use a numbing medicine during treatment. If so, you may need to avoid eating until the medicine wears off. Ask your dentist about this.  Only take medicine as told by your dentist or doctor.  Avoid chewing food near the painful tooth until after all treatment is done. Ask your dentist about this. GET HELP RIGHT AWAY IF:   The problem gets worse or new problems appear.  You have a fever.  There is redness and puffiness (swelling) of the face, jaw, or neck.  You cannot open your mouth.  There is pain in the jaw.  There is very bad pain that is not helped by medicine. MAKE SURE YOU:   Understand these instructions.  Will watch your condition.  Will get help right away if you are not doing well or get worse. Document Released: 05/02/2008 Document Revised: 02/06/2012 Document Reviewed: 05/02/2008 [facility] Patient Information 2015 [place], [place]. This information is not intended to replace advice given to you by your health care provider. Make sure you discuss any questions you have with your health care provider.  Emergency Department Resource Guide 1) Find a Doctor and Pay Out of Pocket Although you won't have to find out who is covered by your insurance plan, it is a good idea to ask around and get recommendations. You will then need to call the office and see if the doctor you have chosen will accept you as a new patient and what types of options they offer for patients  who are self-pay. Some doctors offer discounts or will set up payment plans for their patients who do not have insurance, but you will need to ask so you aren't surprised when you get to your appointment.  2) Contact Your Local Health Department Not all health departments have doctors that can see patients for sick visits, but many do, so it is worth a call to see if yours does. If you don't know where your local health department is, you can check in your phone book. The CDC also has a tool to help you locate your state's health department, and many state websites also have listings of all of their local health departments.  3) Find a Walk-in Clinic If your illness is not likely to be very severe or complicated, you may want to try a  walk in clinic. These are popping up all over the country in pharmacies, drugstores, and shopping centers. They're usually staffed by nurse practitioners or physician assistants that have been trained to treat common illnesses and complaints. They're usually fairly quick and inexpensive. However, if you have serious medical issues or chronic medical problems, these are probably not your best option.  No Primary Care Doctor: - Call Health Connect at  [phone] - they can help you locate a primary care doctor that  accepts your insurance, provides certain services, etc. - Physician Referral Service- [phone]  Chronic Pain Problems: Organization         Address  Phone   Notes  [facility]  [phone] Patients need to be referred by their primary care doctor.   Medication Assistance: Organization         Address  Phone   Notes  [facility] [address] [place], [place] [address] [phone] --Must be a resident of [place] [facility] [place] -- Must have NO insurance coverage whatsoever (no Medicaid/ Medicare, etc.) -- The pt. MUST have a primary care doctor that directs their care regularly and follows  them in the community   [facility]ist  [phone]   [employer]  [phone]     Dental Care: Organization         Address  Phone  Notes  [facility] [address], [place] [phone] Accepts children up to age 25 who are enrolled in [place] or [place] Health Choice; pregnant women with a Medicaid card; and children who have applied for Medicaid or [place] Health Choice, but were declined, whose parents can pay a reduced fee at time of service.  [facility] [place]  [address], [place] [phone] Accepts children up to age 421 who are enrolled in [place] or [employer] Health Choice; pregnant women with a Medicaid card; and children who have applied for Medicaid or [place] Health Choice, but were declined, whose parents can pay a reduced fee at time of service.  [facility]  [address], [place] [phone] Patients are seen by appointment only. Walk-ins are not accepted. [facility] will see patients 25 years of age and older. Monday - Tuesday (8am-5pm) Most Wednesdays (8:30-5pm) $30 per visit, cash only  [facility]  [address], [facility] [place] [place] [phone] Patients are seen by appointment only. Walk-ins are not accepted. [facility] will see patients 25 years of age and older. One Wednesday Evening (Monthly: Volunteer Based).  $30 per visit, cash only  [facility]  [phone] for adults; Children under age 414, call Graduate Pediatric Dentistry at [phone]. Children aged 464-14, please call [phone] to request a pediatric application.  Dental services are provided in all areas of dental care including fillings, crowns and bridges, complete and partial dentures, implants, gum treatment, root canals, and extractions. Preventive care is also provided. Treatment is provided to both adults and  children. Patients are selected via a lottery and there is often a waiting list.   [facility] [address], [place]  [phone] [email or web address]   [facility] [address], [place], [place] [phone], Ext. [phone] Second and Fourth Thursday of each month, opens at 6:30 AM; Clinic ends at 9 AM.  Patients are  seen on a first-come first-served basis, and a limited number are seen during each clinic.   [facility]  [address], [place], [place] [phone]   Eligibility Requirements You must have lived in [place], or [place] for at least the last three months.   You cannot be eligible for state or federal sponsored [place]healthcare insurance, including [employer], [place], or [employer]icare.   You generally cannot be eligible for healthcare insurance through your employer.    How to apply: Eligibility screenings are held every Tuesday and Wednesday afternoon from 1:00 pm until 4:00 pm. You do not need an appointment for the interview!  [facility] [address], [place], [place] [phone]   [facility]  [phone]   [facility]  [phone]   [facility]  [phone]
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
## 2015-07-16 ENCOUNTER — Emergency Department (HOSPITAL_COMMUNITY)
Admission: EM | Admit: 2015-07-16 | Discharge: 2015-07-16 | Disposition: A | Discharge status: Home / Self Care | Payer: Medicaid Other | Attending: Emergency Medicine | Admitting: Emergency Medicine

## 2015-07-16 ENCOUNTER — Encounter (HOSPITAL_COMMUNITY): Payer: Self-pay | Admitting: Emergency Medicine

## 2015-07-16 DIAGNOSIS — K0381 Cracked tooth: Secondary | ICD-10-CM | POA: Insufficient documentation

## 2015-07-16 DIAGNOSIS — Z720 Tobacco use: Secondary | ICD-10-CM | POA: Insufficient documentation

## 2015-07-16 DIAGNOSIS — K029 Dental caries, unspecified: Secondary | ICD-10-CM | POA: Insufficient documentation

## 2015-07-16 MED ORDER — TRAMADOL HCL 50 MG PO TABS
50.0000 mg | ORAL_TABLET | Freq: Once | ORAL | Status: AC
  Administered 2015-07-16: 50 mg via ORAL
  Filled 2015-07-16: qty 1

## 2015-07-16 MED ORDER — TRAMADOL HCL 50 MG PO TABS: 50.0000 mg | ORAL_TABLET | Freq: Once | ORAL | Status: DC

## 2015-07-16 NOTE — Discharge Instructions (Signed)
Dental Caries Dental caries is tooth decay. This decay can cause a hole in teeth (cavity) that can get bigger and deeper over time. HOME CARE  Brush and floss your teeth. Do this at least two times a day.  Use a fluoride toothpaste.  Use a mouth rinse if told by your dentist or doctor.  Eat less sugary and starchy foods. Drink less sugary drinks.  Avoid snacking often on sugary and starchy foods. Avoid sipping often on sugary drinks.  Keep regular checkups and cleanings with your dentist.  Use fluoride supplements if told by your dentist or doctor.  Allow fluoride to be applied to teeth if told by your dentist or doctor. Document Released: 08/23/2008 Document Revised: 03/31/2014 Document Reviewed: 11/16/2012 [facility] Patient Information 2015 [place], [place]. This information is not intended to replace advice given to you by your health care provider. Make sure you discuss any questions you have with your health care provider. You have been given a referral to Dr. Russella Dar, please call and make an appointment.  They will make every effort to see you within a 24-hour period.  If you tell them you referred through the emergency department

## 2015-07-16 NOTE — ED Provider Notes (Signed)
CSN: [id number]     Arrival date & time 07/16/15  0508 History   First MD Initiated Contact with Patient 07/16/15 0522     No chief complaint on file.    (Consider location/radiation/quality/duration/timing/severity/associated sxs/prior Treatment) HPI Comments: Asian states that she developed a headache one hour ago.  She also states that her left lower tooth has been bothering her for "a while".  She states she's taken ibuprofen or Tylenol without any relief.  She does not have a dentist.  The history is provided by the patient.    Past Medical History  Diagnosis Date  . No pertinent past medical history   . Medical history non-contributory    Past Surgical History  Procedure Laterality Date  . No past surgeries     No family history on file. Social History  Substance Use Topics  . Smoking status: Current Some Day Smoker -- 0.25 packs/day for 2 years    Types: Cigarettes  . Smokeless tobacco: Never Used  . Alcohol Use: No   OB History    Gravida Para Term Preterm AB TAB SAB Ectopic Multiple Living   Review of Systems  Constitutional: Negative for fever.  HENT: Positive for dental problem.   Neurological: Positive for headaches. Negative for dizziness.  All other systems reviewed and are negative.     Allergies  Review of patient's allergies indicates no known allergies.  Home Medications   Prior to Admission medications   Medication Sig Start Date End Date Taking? Authorizing Provider  acetaminophen (TYLENOL) 325 MG tablet Take 650 mg by mouth every 6 (six) hours as needed for mild pain.    Historical Provider, MD  amoxicillin-clavulanate (AUGMENTIN) 875-125 MG per tablet Take 1 tablet by mouth 2 (two) times daily. One po bid x 7 days. START TAKING IF YOUR DENTAL PAIN WORSENS OR DEVELOPS INFECTION 11/05/14   Mercedes Camprubi-Soms, PA-C  cyclobenzaprine (FLEXERIL) 10 MG tablet Take 1 tablet (10 mg total) by mouth 3 (three) times daily as needed  for muscle spasms. 11/05/14   Mercedes Camprubi-Soms, PA-C  HYDROcodone-acetaminophen (NORCO) 5-325 MG per tablet Take 1-2 tablets by mouth every 6 (six) hours as needed for severe pain. 11/05/14   Mercedes Camprubi-Soms, PA-C  naproxen (NAPROSYN) 500 MG tablet Take 1 tablet (500 mg total) by mouth 2 (two) times daily as needed for mild pain, moderate pain or headache (TAKE WITH MEALS.). 11/05/14   Mercedes Camprubi-Soms, PA-C  traMADol (ULTRAM) 50 MG tablet Take 1 tablet (50 mg total) by mouth once. 07/16/15   Earley Favor, NP   BP 142/92 mmHg  Pulse 64  Temp(Src) 98.3 F (36.8 C) (Oral)  Resp 20  SpO2 99%  LMP 07/07/2015 Physical Exam  Constitutional: She appears well-developed and well-nourished.  HENT:  Head: Normocephalic and atraumatic.  Right Ear: External ear normal.  Left Ear: External ear normal.  Mouth/Throat:    Eyes: Pupils are equal, round, and reactive to light.  Neck: Normal range of motion.  Cardiovascular: Normal rate.   Pulmonary/Chest: Effort normal.  Lymphadenopathy:    She has no cervical adenopathy.  Neurological: She is alert.  Skin: Skin is warm and dry.  Nursing note and vitals reviewed.   ED Course  Procedures (including critical care time) Labs Review Labs Reviewed - No data to display  Imaging Review No results found. I have personally reviewed and evaluated these images and lab results as part of  my medical decision-making.   EKG Interpretation None     Will refer patient to dentist.  No D Freda by attic at this time.  She does have a broken left lower wisdom tooth.  She been given a prescription for Ultram MDM   Final diagnoses:  Dental caries         Earley Favor, NP 07/16/15 7829  Loren Racer, MD 07/16/15 [phone]

## 2015-07-16 NOTE — ED Notes (Signed)
Pt presents c/o left lower dental pain and HA x1 hour with nausea.

## 2015-08-21 ENCOUNTER — Telehealth (HOSPITAL_BASED_OUTPATIENT_CLINIC_OR_DEPARTMENT_OTHER): Payer: Self-pay | Admitting: Emergency Medicine

## 2015-08-23 ENCOUNTER — Emergency Department (HOSPITAL_COMMUNITY)
Admission: EM | Admit: 2015-08-23 | Discharge: 2015-08-23 | Disposition: A | Discharge status: Home / Self Care | Payer: Medicaid Other | Attending: Emergency Medicine | Admitting: Emergency Medicine

## 2015-08-23 DIAGNOSIS — K088 Other specified disorders of teeth and supporting structures: Secondary | ICD-10-CM | POA: Insufficient documentation

## 2015-08-23 DIAGNOSIS — K0889 Other specified disorders of teeth and supporting structures: Secondary | ICD-10-CM

## 2015-08-23 DIAGNOSIS — R51 Headache: Secondary | ICD-10-CM | POA: Insufficient documentation

## 2015-08-23 DIAGNOSIS — Z720 Tobacco use: Secondary | ICD-10-CM | POA: Insufficient documentation

## 2015-08-23 MED ORDER — IBUPROFEN 800 MG PO TABS
800.0000 mg | ORAL_TABLET | Freq: Once | ORAL | Status: AC
  Administered 2015-08-23: 800 mg via ORAL
  Filled 2015-08-23: qty 1

## 2015-08-23 MED ORDER — PENICILLIN V POTASSIUM 500 MG PO TABS: 500.0000 mg | ORAL_TABLET | Freq: Three times a day (TID) | ORAL | Status: DC

## 2015-08-23 MED ORDER — OXYCODONE-ACETAMINOPHEN 5-325 MG PO TABS
1.0000 | ORAL_TABLET | Freq: Once | ORAL | Status: AC
  Administered 2015-08-23: 1 via ORAL
  Filled 2015-08-23: qty 1

## 2015-08-23 MED ORDER — NAPROXEN 500 MG PO TABS: 500.0000 mg | ORAL_TABLET | Freq: Two times a day (BID) | ORAL | Status: DC

## 2015-08-23 NOTE — ED Provider Notes (Signed)
CSN: [id number]     Arrival date & time 08/23/15  1204 History   First MD Initiated Contact with Patient 08/23/15 1215     Chief Complaint  Patient presents with  . Dental Pain  . Headache     (Consider location/radiation/quality/duration/timing/severity/associated sxs/prior Treatment) HPI  Pt presents with c/o left upper molar dental pain.  She states that the pain has been ongiong for the past week and today began to cause a headache to the left side of her head and face.  She states she tried excedrin last week but this is no longer helping.  No fever/chills.  No vomiting.  No difficulty swallowing or breathing.  She has not tried to contact a dentist yet about her symptoms.  There are no other associated systemic symptoms, there are no other alleviating or modifying factors.   Pain is constant and throbbing.    Past Medical History  Diagnosis Date  . No pertinent past medical history   . Medical history non-contributory    Past Surgical History  Procedure Laterality Date  . No past surgeries     No family history on file. Social History  Substance Use Topics  . Smoking status: Current Some Day Smoker -- 0.25 packs/day for 2 years    Types: Cigarettes  . Smokeless tobacco: Never Used  . Alcohol Use: No   OB History    Gravida Para Term Preterm AB TAB SAB Ectopic Multiple Living   Review of Systems  ROS reviewed and all otherwise negative except for mentioned in HPI    Allergies  Review of patient's allergies indicates no known allergies.  Home Medications   Prior to Admission medications   Medication Sig Start Date End Date Taking? Authorizing Provider  acetaminophen (TYLENOL) 325 MG tablet Take 650 mg by mouth every 6 (six) hours as needed for mild pain.   Yes Historical Provider, MD  aspirin-acetaminophen-caffeine (EXCEDRIN MIGRAINE) 318 196 0424 MG per tablet Take 2 tablets by mouth every 6 (six) hours as needed for headache.   Yes Historical  Provider, MD  ibuprofen (ADVIL,MOTRIN) 200 MG tablet Take 800 mg by mouth every 6 (six) hours as needed for headache.   Yes Historical Provider, MD  naproxen sodium (ANAPROX) 220 MG tablet Take 440 mg by mouth 2 (two) times daily as needed (pain).   Yes Historical Provider, MD  naproxen (NAPROSYN) 500 MG tablet Take 1 tablet (500 mg total) by mouth 2 (two) times daily. 08/23/15   Jerelyn Scott, MD  penicillin v potassium (VEETID) 500 MG tablet Take 1 tablet (500 mg total) by mouth 3 (three) times daily. 08/23/15   Jerelyn Scott, MD  traMADol (ULTRAM) 50 MG tablet Take 1 tablet (50 mg total) by mouth once. Patient not taking: Reported on 08/23/2015 07/16/15   Earley Favor, NP   BP 131/76 mmHg  Pulse 76  Temp(Src) 98.1 F (36.7 C) (Oral)  Resp 18  SpO2 100%  Vitals reviewed Physical Exam  Physical Examination: General appearance - alert, well appearing, and in no distress Mental status - alert, oriented to person, place, and time Eyes -no conjunctival injection, no scleral icterus Mouth - mucous membranes moist, pharynx normal without lesions, left upper posterior molar erupting through gingiva, no swelling under tongue Neck - supple, no significant adenopathy Chest - clear to auscultation, no wheezes, rales or rhonchi, symmetric air entry Heart - normal rate, regular rhythm, normal S1, S2, no  murmurs, rubs, clicks or gallops Neurological - alert, oriented, normal speech,  Extremities - peripheral pulses normal, no pedal edema, no clubbing or cyanosis Skin - normal coloration and turgor, no rashes  ED Course  Procedures (including critical care time) Labs Review Labs Reviewed - No data to display  Imaging Review No results found. I have personally reviewed and evaluated these images and lab results as part of my medical decision-making.   EKG Interpretation None      MDM   Final diagnoses:  Pain, dental    Pt presenting with c/o dental pain in left upper posterior molar  resulting in left sided headache.  Pt treated with naproxen and antibiotics as well as advised dental followup.  Discharged with strict return precautions.  Pt agreeable with plan.    Jerelyn Scott, MD 08/23/15 [phone]

## 2015-08-23 NOTE — ED Notes (Signed)
Pt reports L lower dental pain and L sided HA. HA started this am.

## 2015-08-23 NOTE — Discharge Instructions (Signed)
Return to the ED with any concerns including fever/chills, vomiting and not able to keep down liquids, not able to swallow liquids, decreased level of alertness/lethargy, or any other alarming symptoms

## 2015-12-08 ENCOUNTER — Emergency Department (HOSPITAL_COMMUNITY)
Admission: EM | Admit: 2015-12-08 | Discharge: 2015-12-08 | Disposition: A | Discharge status: Home / Self Care | Payer: Self-pay | Attending: Emergency Medicine | Admitting: Emergency Medicine

## 2015-12-08 ENCOUNTER — Emergency Department (HOSPITAL_COMMUNITY): Payer: Medicaid Other

## 2015-12-08 ENCOUNTER — Encounter (HOSPITAL_COMMUNITY): Payer: Self-pay

## 2015-12-08 DIAGNOSIS — Z791 Long term (current) use of non-steroidal anti-inflammatories (NSAID): Secondary | ICD-10-CM | POA: Insufficient documentation

## 2015-12-08 DIAGNOSIS — Z79899 Other long term (current) drug therapy: Secondary | ICD-10-CM | POA: Insufficient documentation

## 2015-12-08 DIAGNOSIS — Z3202 Encounter for pregnancy test, result negative: Secondary | ICD-10-CM | POA: Insufficient documentation

## 2015-12-08 DIAGNOSIS — K029 Dental caries, unspecified: Secondary | ICD-10-CM | POA: Insufficient documentation

## 2015-12-08 DIAGNOSIS — Z792 Long term (current) use of antibiotics: Secondary | ICD-10-CM | POA: Insufficient documentation

## 2015-12-08 DIAGNOSIS — F17210 Nicotine dependence, cigarettes, uncomplicated: Secondary | ICD-10-CM | POA: Insufficient documentation

## 2015-12-08 DIAGNOSIS — M542 Cervicalgia: Secondary | ICD-10-CM | POA: Insufficient documentation

## 2015-12-08 DIAGNOSIS — K047 Periapical abscess without sinus: Secondary | ICD-10-CM | POA: Insufficient documentation

## 2015-12-08 DIAGNOSIS — R112 Nausea with vomiting, unspecified: Secondary | ICD-10-CM | POA: Insufficient documentation

## 2015-12-08 LAB — CBC WITH DIFFERENTIAL/PLATELET
Basophils Absolute: 0 10*3/uL (ref 0.0–0.1)
Basophils Relative: 0 %
EOS ABS: 0 10*3/uL (ref 0.0–0.7)
EOS PCT: 0 %
HEMATOCRIT: 36.8 % (ref 36.0–46.0)
Hemoglobin: 12 g/dL (ref 12.0–15.0)
LYMPHS ABS: 0.5 10*3/uL — AB (ref 0.7–4.0)
Lymphocytes Relative: 7 %
MCH: 26.5 pg (ref 26.0–34.0)
MCHC: 32.6 g/dL (ref 30.0–36.0)
MCV: 81.2 fL (ref 78.0–100.0)
MONOS PCT: 8 %
Monocytes Absolute: 0.6 10*3/uL (ref 0.1–1.0)
Neutro Abs: 6.4 10*3/uL (ref 1.7–7.7)
Neutrophils Relative %: 85 %
Platelets: 146 10*3/uL — ABNORMAL LOW (ref 150–400)
RBC: 4.53 MIL/uL (ref 3.87–5.11)
RDW: 13.3 % (ref 11.5–15.5)
WBC: 7.5 10*3/uL (ref 4.0–10.5)

## 2015-12-08 LAB — BASIC METABOLIC PANEL
ANION GAP: 10 (ref 5–15)
BUN: 7 mg/dL (ref 6–20)
CALCIUM: 9.2 mg/dL (ref 8.9–10.3)
CO2: 23 mmol/L (ref 22–32)
Chloride: 103 mmol/L (ref 101–111)
Creatinine, Ser: 0.62 mg/dL (ref 0.44–1.00)
GFR calc Af Amer: >60 mL/min (ref >60)
GFR calc non Af Amer: >60 mL/min (ref >60)
GLUCOSE: 109 mg/dL — AB (ref 65–99)
Potassium: 3.3 mmol/L — ABNORMAL LOW (ref 3.5–5.1)
Sodium: 136 mmol/L (ref 135–145)

## 2015-12-08 LAB — POC URINE PREG, ED: Preg Test, Ur: NEGATIVE

## 2015-12-08 MED ORDER — ONDANSETRON 4 MG PO TBDP
4.0000 mg | ORAL_TABLET | Freq: Once | ORAL | Status: AC
Start: 2015-12-08 — End: 2015-12-08
  Administered 2015-12-08: 4 mg via ORAL
  Filled 2015-12-08: qty 1

## 2015-12-08 MED ORDER — POTASSIUM CHLORIDE CRYS ER 20 MEQ PO TBCR
40.0000 meq | EXTENDED_RELEASE_TABLET | Freq: Once | ORAL | Status: AC
  Administered 2015-12-08: 40 meq via ORAL
  Filled 2015-12-08: qty 2

## 2015-12-08 MED ORDER — IBUPROFEN 800 MG PO TABS: 800.0000 mg | ORAL_TABLET | Freq: Three times a day (TID) | ORAL | Status: DC

## 2015-12-08 MED ORDER — IOHEXOL 300 MG/ML  SOLN
75.0000 mL | Freq: Once | INTRAMUSCULAR | Status: AC | PRN
  Administered 2015-12-08: 75 mL via INTRAVENOUS

## 2015-12-08 MED ORDER — IBUPROFEN 800 MG PO TABS
800.0000 mg | ORAL_TABLET | Freq: Once | ORAL | Status: AC
  Administered 2015-12-08: 800 mg via ORAL
  Filled 2015-12-08: qty 1

## 2015-12-08 MED ORDER — ONDANSETRON HCL 4 MG PO TABS: 4.0000 mg | ORAL_TABLET | Freq: Four times a day (QID) | ORAL | Status: DC

## 2015-12-08 MED ORDER — PENICILLIN V POTASSIUM 500 MG PO TABS: 500.0000 mg | ORAL_TABLET | Freq: Four times a day (QID) | ORAL | Status: AC

## 2015-12-08 MED ORDER — PENICILLIN V POTASSIUM 500 MG PO TABS
500.0000 mg | ORAL_TABLET | Freq: Once | ORAL | Status: AC
  Administered 2015-12-08: 500 mg via ORAL
  Filled 2015-12-08: qty 1

## 2015-12-08 MED ORDER — OXYCODONE-ACETAMINOPHEN 5-325 MG PO TABS
1.0000 | ORAL_TABLET | Freq: Once | ORAL | Status: AC
  Administered 2015-12-08: 1 via ORAL
  Filled 2015-12-08: qty 1

## 2015-12-08 NOTE — ED Provider Notes (Signed)
Complains of pain at tooth #17 for "a couple months which went away until last night. She's also reports 2 episodes of vomiting since since last night with subjective fever. Exam patient is alert and nontoxic handling secretions well HEENT exam obvious dental caries tooth #17 with no surrounding fluctuance of gingiva.. No trismus neck is supple. She is nontender at submandibular area and submandibular area is not firm. Neck is supple. No fullness or swelling. Full range of motion.  Suspect dental abscesses etiology of fever.  Doug SouSam Broly Hatfield, MD 12/08/15 [phone]

## 2015-12-08 NOTE — ED Notes (Signed)
Pt complains of her left bottom wisdom tooth hurting since yesterday

## 2015-12-08 NOTE — ED Provider Notes (Signed)
CSN: [id number]     Arrival date & time 12/08/15  4098 History   First MD Initiated Contact with Patient 12/08/15 0622     Chief Complaint  Patient presents with  . Dental Pain   HPI  Kristen Garza is a 25 year old female presenting with dental pain. She reports onset of the pain yesterday. The pain is located in the left lower jaw. She states that it is her wisdom tooth hurting. She describes the pain as severe. She states that it is exacerbated by chewing and touch. The pain radiates into the left side of her neck. She also endorses nausea and vomiting. She reports trying to eat chicken noodle soup last evening and vomited soon after. She has been seen in the emergency department twice before for dental pain but has yet to follow up with a dentist. Denies difficulty handling secretions, swallowing or breathing. She denies fevers but is febrile in the emergency department. Denies headache, dizziness, syncope, ear pain, chest pain, SOB, abdominal pain, diarrhea or myalgias.   Past Medical History  Diagnosis Date  . No pertinent past medical history   . Medical history non-contributory    Past Surgical History  Procedure Laterality Date  . No past surgeries     History reviewed. No pertinent family history. Social History  Substance Use Topics  . Smoking status: Current Some Day Smoker -- 0.25 packs/day for 2 years    Types: Cigarettes  . Smokeless tobacco: Never Used  . Alcohol Use: No   OB History    Gravida Para Term Preterm AB TAB SAB Ectopic Multiple Living   3 2 2       2      Review of Systems  Constitutional: Positive for fever.  HENT: Positive for dental problem. Negative for drooling, sore throat and trouble swallowing.   Respiratory: Negative for shortness of breath.   Cardiovascular: Negative for chest pain.  Gastrointestinal: Positive for nausea and vomiting.  Musculoskeletal: Positive for neck pain. Negative for neck stiffness.  Skin: Negative for color change.    All other systems reviewed and are negative.     Allergies  Review of patient's allergies indicates no known allergies.  Home Medications   Prior to Admission medications   Medication Sig Start Date End Date Taking? Authorizing Provider  acetaminophen (TYLENOL) 325 MG tablet Take 650 mg by mouth every 6 (six) hours as needed for mild pain.    Historical Provider, MD  aspirin-acetaminophen-caffeine (EXCEDRIN MIGRAINE) [phone] MG per tablet Take 2 tablets by mouth every 6 (six) hours as needed for headache.    Historical Provider, MD  ibuprofen (ADVIL,MOTRIN) 200 MG tablet Take 800 mg by mouth every 6 (six) hours as needed for headache.    Historical Provider, MD  ibuprofen (ADVIL,MOTRIN) 800 MG tablet Take 1 tablet (800 mg total) by mouth 3 (three) times daily. 12/08/15   Kristen Hassan, PA-C  naproxen (NAPROSYN) 500 MG tablet Take 1 tablet (500 mg total) by mouth 2 (two) times daily. 08/23/15   Jerelyn Scott, MD  naproxen sodium (ANAPROX) 220 MG tablet Take 440 mg by mouth 2 (two) times daily as needed (pain).    Historical Provider, MD  ondansetron (ZOFRAN) 4 MG tablet Take 1 tablet (4 mg total) by mouth every 6 (six) hours. 12/08/15   Kristen Selmer, PA-C  penicillin v potassium (VEETID) 500 MG tablet Take 1 tablet (500 mg total) by mouth 3 (three) times daily. 08/23/15   Jerelyn Scott, MD  penicillin  v potassium (VEETID) 500 MG tablet Take 1 tablet (500 mg total) by mouth 4 (four) times daily. 12/08/15 12/15/15  Kristen Arriaga, PA-C  traMADol (ULTRAM) 50 MG tablet Take 1 tablet (50 mg total) by mouth once. Patient not taking: Reported on 08/23/2015 07/16/15   Earley FavorGail Schulz, NP   BP 132/79 mmHg  Pulse 90  Temp(Src) 100.4 F (38 C) (Oral)  Resp 18  SpO2 100%  LMP 11/24/2015 Physical Exam  Constitutional: She appears well-developed and well-nourished. She appears distressed.  Tearful. Nontoxic appearing  HENT:  Head: Normocephalic and atraumatic.  Mouth/Throat: Uvula is midline and  oropharynx is clear and moist. Mucous membranes are not dry. No trismus in the jaw. Dental caries present. No dental abscesses.    Left lower 3rd molar TTP with tongue blade. No erythema of the gumline surrounding. No obvious dental abscess. No trismus. Pt handling secretions well. No edema of the oropharynx. No soft tissue swelling or induration of the cheek.   Eyes: Conjunctivae are normal. Right eye exhibits no discharge. Left eye exhibits no discharge. No scleral icterus.  Neck: Normal range of motion. Neck supple. Muscular tenderness present. No edema, no erythema and normal range of motion present.    Tenderness of left lateral neck as indicated in diagram. No soft tissue swelling of the neck. FROM intact.   Cardiovascular: Normal rate, regular rhythm and normal heart sounds.   Pulmonary/Chest: Effort normal and breath sounds normal. No stridor. No respiratory distress.  Breathing unlabored  Musculoskeletal: Normal range of motion.  Neurological: She is alert. Coordination normal.  Skin: Skin is warm and dry.  Psychiatric: She has a normal mood and affect. Her behavior is normal.  Nursing note and vitals reviewed.   ED Course  Procedures (including critical care time) Labs Review Labs Reviewed  CBC WITH DIFFERENTIAL/PLATELET - Abnormal; Notable for the following:    Platelets 146 (*)    Lymphs Abs 0.5 (*)    All other components within normal limits  BASIC METABOLIC PANEL - Abnormal; Notable for the following:    Potassium 3.3 (*)    Glucose, Bld 109 (*)    All other components within normal limits  POC URINE PREG, ED    Imaging Review Ct Soft Tissue Neck W Contrast  12/08/2015  CLINICAL DATA:  Left neck pain and swelling for 1 day. Dental pain. Fever. EXAM: CT NECK WITH CONTRAST TECHNIQUE: Multidetector CT imaging of the neck was performed using the standard protocol following the bolus administration of intravenous contrast. CONTRAST:  75mL OMNIPAQUE IOHEXOL 300 MG/ML   SOLN COMPARISON:  None. FINDINGS: Pharynx and larynx: There is asymmetric left-sided edema without compromise of the airway. There is infiltration into the left parapharyngeal fat area of the palatine tonsils are within normal limits bilaterally. No focal mucosal or submucosal lesion is present. Vocal cords are midline and symmetric. Salivary glands: Inflammatory changes are present within the left submandibular gland. The right submandibular gland and bilateral parotid glands are unremarkable. Thyroid: Negative Lymph nodes: Asymmetric reactive type left-sided level 2 lymph nodes are present. There also a reactive type lymph nodes in the left submandibular space. The largest node measures 17 x 11 mm. Diffuse inflammatory changes are present in the left floor of mouth with infiltration of the fat. There is asymmetric thickening of the left platysma muscle and inflammatory changes within the subcutaneous fat. There is thickening of the mylohyoid muscle as well. Vascular: Negative Limited intracranial: Within normal limits Visualized orbits: Unremarkable Mastoids and visualized paranasal  sinuses: Clear Skeleton: There straightening of the normal cervical lordosis. Vertebral body heights and alignment are otherwise normal. A prominent dental caries is present along the medial aspect of the left third mandibular molar. There is periapical lucency with cortical breakthrough along the medial aspect of the horizontal ramus of the mandible. A small subperiosteal abscess is present with inflammatory changes extending into the floor of the mouth. Upper chest: Lung apices are clear. IMPRESSION: 1. Prominent dental caries within the left third mandibular molar and periapical abscess with associated medial mandibular cortical destruction and subperiosteal abscess. 2. Diffuse inflammatory changes extending into the left floor of mouth, the left submandibular gland and reactive type adenopathy in the left submandibular and level 2  stations. 3. Diffuse inflammatory changes involving the left mylohyoid muscle, left platysma muscle, and subcutaneous spaces. 4. No evidence for neoplasm. Electronically Signed   By: Marin Roberts M.D.   On: 12/08/2015 08:39   I have personally reviewed and evaluated these images and lab results as part of my medical decision-making.   EKG Interpretation None      MDM   Final diagnoses:  Dental abscess   Patient presenting with tooth pain. No obvious abscess on exam. No soft tissue swelling of the cheek or neck. No trismus. Pt handling secretions and in no respiratory distress. Pain controlled in ED with percocet. Nausea resolved with zofran. Febrile in ED to 100.4 and given tylenol. No WBC elevation. Potassium 3.3 and repleted. No other lab abnormalities. CT neck shows prominent dental caries with periapical and subperiosteal abscess. Patient assessed by attending (Dr. Ethelda Chick).  Patient will be discharged with PCN VK 500 mg QID and strict instruction to follow up with dentist today. Given information for on call dentist (Dr. Russella Dar) plus resource guide. Return precautions discussed with pt and given in discharge paperwork. Stable for discharge.   Rolm Gala Manoah Deckard, PA-C 12/08/15 1610  Doug Sou, MD 12/08/15 9604

## 2015-12-08 NOTE — Discharge Instructions (Signed)
Call the dentist (Dr. Russella Dar) today. I have attached your CT results below to give to him. It is extremely important to get in to see him as soon as possible. I have attached the dental resource guide below with other dental clinics if you cannot get in with Dr. Russella Dar. Take the penicillin as prescribed. Return to ED with worsening pain, inability to swallow, inability to move the neck, shortness of breath or any other new or concerning symptoms.  1. Prominent dental caries within the left third mandibular molar and periapical abscess with associated medial mandibular cortical destruction and subperiosteal abscess. 2. Diffuse inflammatory changes extending into the left floor of mouth, the left submandibular gland and reactive type adenopathy in the left submandibular and level 2 stations. 3. Diffuse inflammatory changes involving the left mylohyoid muscle, left platysma muscle, and subcutaneous spaces. 4. No evidence for neoplasm.   Dental Abscess A dental abscess is a collection of pus in or around a tooth. CAUSES This condition is caused by a bacterial infection around the root of the tooth that involves the inner part of the tooth (pulp). It may result from:  Severe tooth decay.  Trauma to the tooth that allows bacteria to enter into the pulp, such as a broken or chipped tooth.  Severe gum disease around a tooth. SYMPTOMS Symptoms of this condition include:  Severe pain in and around the infected tooth.  Swelling and redness around the infected tooth, in the mouth, or in the face.  Tenderness.  Pus drainage.  Bad breath.  Bitter taste in the mouth.  Difficulty swallowing.  Difficulty opening the mouth.  Nausea.  Vomiting.  Chills.  Swollen neck glands.  Fever. DIAGNOSIS This condition is diagnosed with examination of the infected tooth. During the exam, your dentist may tap on the infected tooth. Your dentist will also ask about your medical and dental  history and may order X-rays. TREATMENT This condition is treated by eliminating the infection. This may be done with:  Antibiotic medicine.  A root canal. This may be performed to save the tooth.  Pulling (extracting) the tooth. This may also involve draining the abscess. This is done if the tooth cannot be saved. HOME CARE INSTRUCTIONS  Take medicines only as directed by your dentist.  If you were prescribed antibiotic medicine, finish all of it even if you start to feel better.  Rinse your mouth (gargle) often with salt water to relieve pain or swelling.  Do not drive or operate heavy machinery while taking pain medicine.  Do not apply heat to the outside of your mouth.  Keep all follow-up visits as directed by your dentist. This is important. SEEK MEDICAL CARE IF:  Your pain is worse and is not helped by medicine. SEEK IMMEDIATE MEDICAL CARE IF:  You have a fever or chills.  Your symptoms suddenly get worse.  You have a very bad headache.  You have problems breathing or swallowing.  You have trouble opening your mouth.  You have swelling in your neck or around your eye.   This information is not intended to replace advice given to you by your health care provider. Make sure you discuss any questions you have with your health care provider.   Document Released: 11/14/2005 Document Revised: 03/31/2015 Document Reviewed: 11/11/2014 Elsevier Interactive Patient Education 2016 [employer].   Dental Care: Organization         Address  Phone  Notes  [facility]  [address], [place] [phone] Accepts children up to age 27 who are enrolled in [place] or [place] Health Choice; pregnant women with a Medicaid card; and children who have applied for Medicaid or [place] Health Choice, but were declined, whose parents can pay a reduced fee at time of service.  [facility] [place]   [address], [place] [phone] Accepts children up to age 27 who are enrolled in [place] or [place] Health Choice; pregnant women with a Medicaid card; and children who have applied for Medicaid or [place] Health Choice, but were declined, whose parents can pay a reduced fee at time of service.  [facility]  [address], [place] [phone] Patients are seen by appointment only. Walk-ins are not accepted. [facility] will see patients 27 years of age and older. Monday - Tuesday (8am-5pm) Most Wednesdays (8:30-5pm) $30 per visit, cash only  [facility]  [address], [place] [phone] Patients are seen by appointment only. Walk-ins are not accepted. [facility] will see patients 27 years of age and older. One Wednesday Evening (Monthly: Volunteer Based).  $30 per visit, cash only  [facility]  [phone] for adults; Children under age 564, call Graduate Pediatric Dentistry at [phone]. Children aged 474-14, please call [phone] to request a pediatric application.  Dental services are provided in all areas of dental care including fillings, crowns and bridges, complete and partial dentures, implants, gum treatment, root canals, and extractions. Preventive care is also provided. Treatment is provided to both adults and children. Patients are selected via a lottery and there is often a waiting list.   [facility] [address], [place]  [phone] [email or web address]   [facility] [address], [place], [place] [phone], Ext. [phone] Second and Fourth Thursday of each month, opens at 6:30 AM; Clinic ends at 9 AM.  Patients are seen on a first-come first-served basis, and a limited number are seen during each clinic.   [facility]  [address], [place], [place] [phone]   Eligibility Requirements You must have lived  in [place], [place], or [place] counties for at least the last three months.   You cannot be eligible for state or federal sponsored [place] insurance, including [employer], [place], or [employer]icare.   You generally cannot be eligible for healthcare insurance through your employer.    How to apply: Eligibility screenings are held every Tuesday and Wednesday afternoon from 1:00 pm until 4:00 pm. You do not need an appointment for the interview!  [facility] [address], [place], [place] [phone]   [facility]  [phone]   [facility]  [phone]   [facility]  [phone]

## 2017-01-25 ENCOUNTER — Ambulatory Visit (HOSPITAL_COMMUNITY)
Admission: EM | Admit: 2017-01-25 | Discharge: 2017-01-25 | Disposition: A | Discharge status: Home / Self Care | Payer: Medicaid Other | Attending: Family Medicine | Admitting: Family Medicine

## 2017-01-25 DIAGNOSIS — R69 Illness, unspecified: Secondary | ICD-10-CM | POA: Diagnosis not present

## 2017-01-25 DIAGNOSIS — J111 Influenza due to unidentified influenza virus with other respiratory manifestations: Secondary | ICD-10-CM

## 2017-01-25 MED ORDER — IBUPROFEN 800 MG PO TABS
ORAL_TABLET | ORAL | Status: AC
  Filled 2017-01-25: qty 1

## 2017-01-25 MED ORDER — ONDANSETRON 4 MG PO TBDP
4.0000 mg | ORAL_TABLET | Freq: Once | ORAL | Status: AC
  Administered 2017-01-25: 4 mg via ORAL

## 2017-01-25 MED ORDER — SODIUM CHLORIDE 0.9 % IV SOLN
Freq: Once | INTRAVENOUS | Status: AC
Start: 2017-01-25 — End: 2017-01-25
  Administered 2017-01-25: 16:00:00 via INTRAVENOUS

## 2017-01-25 MED ORDER — ONDANSETRON HCL 4 MG PO TABS: 4.0000 mg | ORAL_TABLET | Freq: Four times a day (QID) | ORAL | 0 refills | Status: DC

## 2017-01-25 MED ORDER — ONDANSETRON 4 MG PO TBDP
ORAL_TABLET | ORAL | Status: AC
  Filled 2017-01-25: qty 1

## 2017-01-25 MED ORDER — IBUPROFEN 800 MG PO TABS
800.0000 mg | ORAL_TABLET | Freq: Once | ORAL | Status: AC
  Administered 2017-01-25: 800 mg via ORAL

## 2017-01-25 NOTE — ED Provider Notes (Signed)
[facility]    CSN: [id number] Arrival date & time: 01/25/17  1506     History   Chief Complaint Chief Complaint  Patient presents with  . Influenza    HPI Kristen Garza is a 26 y.o. female.   The history is provided by the patient.  Influenza  Presenting symptoms: fever, myalgias, nausea and vomiting   Presenting symptoms: no diarrhea and no rhinorrhea   Severity:  Moderate Onset quality:  Sudden Duration:  8 hours Progression:  Unchanged Chronicity:  New Relieved by:  None tried Worsened by:  Nothing Ineffective treatments:  None tried Associated symptoms: chills, decreased appetite and decreased physical activity     Past Medical History:  Diagnosis Date  . Medical history non-contributory   . No pertinent past medical history     There are no active problems to display for this patient.   Past Surgical History:  Procedure Laterality Date  . NO PAST SURGERIES      OB History    Gravida Para Term Preterm AB Living   3 2 2     2    SAB TAB Ectopic Multiple Live Births           2       Home Medications    Prior to Admission medications   Medication Sig Start Date End Date Taking? Authorizing Provider  acetaminophen (TYLENOL) 325 MG tablet Take 650 mg by mouth every 6 (six) hours as needed for mild pain.    Historical Provider, MD  aspirin-acetaminophen-caffeine (EXCEDRIN MIGRAINE) [phone] MG per tablet Take 2 tablets by mouth every 6 (six) hours as needed for headache.    Historical Provider, MD  ibuprofen (ADVIL,MOTRIN) 200 MG tablet Take 800 mg by mouth every 6 (six) hours as needed for headache.    Historical Provider, MD  ibuprofen (ADVIL,MOTRIN) 800 MG tablet Take 1 tablet (800 mg total) by mouth 3 (three) times daily. 12/08/15   Stevi Barrett, PA-C  naproxen (NAPROSYN) 500 MG tablet Take 1 tablet (500 mg total) by mouth 2 (two) times daily. 08/23/15   Jerelyn ScottMartha Linker, MD  naproxen sodium (ANAPROX) 220 MG tablet Take 440 mg by  mouth 2 (two) times daily as needed (pain).    Historical Provider, MD  ondansetron (ZOFRAN) 4 MG tablet Take 1 tablet (4 mg total) by mouth every 6 (six) hours. Prn n/v. 01/25/17   Linna HoffJames D Kindl, MD  penicillin v potassium (VEETID) 500 MG tablet Take 1 tablet (500 mg total) by mouth 3 (three) times daily. 08/23/15   Jerelyn ScottMartha Linker, MD  traMADol (ULTRAM) 50 MG tablet Take 1 tablet (50 mg total) by mouth once. Patient not taking: Reported on 08/23/2015 07/16/15   Earley FavorGail Schulz, NP    Family History No family history on file.  Social History Social History  Substance Use Topics  . Smoking status: Current Some Day Smoker    Packs/day: 0.25    Years: 2.00    Types: Cigarettes  . Smokeless tobacco: Never Used  . Alcohol use No     Allergies   Patient has no known allergies.   Review of Systems Review of Systems  Constitutional: Positive for appetite change, chills, decreased appetite and fever.  HENT: Negative.  Negative for rhinorrhea.   Respiratory: Negative.   Cardiovascular: Negative.   Gastrointestinal: Positive for nausea and vomiting. Negative for diarrhea.  Genitourinary: Negative.   Musculoskeletal: Positive for myalgias.  All other systems reviewed and are negative.    Physical  Exam Triage Vital Signs ED Triage Vitals [01/25/17 1522]  Enc Vitals Group     BP 118/68     Pulse Rate 86     Resp 16     Temp 102.8 F (39.3 C)     Temp Source Oral     SpO2 99 %     Weight      Height      Head Circumference      Peak Flow      Pain Score      Pain Loc      Pain Edu?      Excl. in GC?    No data found.   Updated Vital Signs BP 118/68 (BP Location: Left Arm)   Pulse 86   Temp 102.8 F (39.3 C) (Oral)   Resp 16   LMP 01/15/2017   SpO2 99%   Breastfeeding? No   Visual Acuity Right Eye Distance:   Left Eye Distance:   Bilateral Distance:    Right Eye Near:   Left Eye Near:    Bilateral Near:     Physical Exam  Constitutional: She is oriented to  person, place, and time. She appears well-developed and well-nourished.  HENT:  Head: Normocephalic.  Nose: Nose normal.  Mouth/Throat: Oropharynx is clear and moist.  Eyes: Pupils are equal, round, and reactive to light.  Neck: Normal range of motion. Neck supple.  Cardiovascular: Normal rate, regular rhythm, normal heart sounds and intact distal pulses.   Pulmonary/Chest: Effort normal and breath sounds normal.  Abdominal: Soft. Bowel sounds are normal.  Lymphadenopathy:    She has no cervical adenopathy.  Neurological: She is alert and oriented to person, place, and time.  Skin: Skin is warm and dry.  Nursing note and vitals reviewed.    UC Treatments / Results  Labs (all labs ordered are listed, but only abnormal results are displayed) Labs Reviewed - No data to display  EKG  EKG Interpretation None       Radiology No results found.  Procedures Procedures (including critical care time)  Medications Ordered in UC Medications  ibuprofen (ADVIL,MOTRIN) tablet 800 mg (800 mg Oral Given 01/25/17 1547)  ondansetron (ZOFRAN-ODT) disintegrating tablet 4 mg (4 mg Oral Given 01/25/17 1537)  0.9 %  sodium chloride infusion ( Intravenous New Bag/Given 01/25/17 1611)     Initial Impression / Assessment and Plan / UC Course  I have reviewed the triage vital signs and the nursing notes.  Pertinent labs & imaging results that were available during my care of the patient were reviewed by me and considered in my medical decision making (see chart for details).    Pt imroved after iv fluids   Final Clinical Impressions(s) / UC Diagnoses   Final diagnoses:  Influenza-like illness    New Prescriptions New Prescriptions   ONDANSETRON (ZOFRAN) 4 MG TABLET    Take 1 tablet (4 mg total) by mouth every 6 (six) hours. Prn Ewell Poe, MD 01/25/17 [id number]

## 2017-01-25 NOTE — ED Notes (Signed)
Discontinued iv, catheter intact.

## 2017-01-26 DIAGNOSIS — N10 Acute tubulo-interstitial nephritis: Secondary | ICD-10-CM

## 2017-01-26 HISTORY — DX: Acute pyelonephritis: N10

## 2017-02-12 ENCOUNTER — Emergency Department (HOSPITAL_COMMUNITY): Payer: Self-pay

## 2017-02-12 ENCOUNTER — Inpatient Hospital Stay (HOSPITAL_COMMUNITY)
Admission: EM | Admit: 2017-02-12 | Discharge: 2017-02-14 | DRG: 690 | Disposition: A | Discharge status: Home / Self Care | Payer: Self-pay | Attending: Internal Medicine | Admitting: Internal Medicine

## 2017-02-12 ENCOUNTER — Encounter (HOSPITAL_COMMUNITY): Payer: Self-pay | Admitting: Emergency Medicine

## 2017-02-12 DIAGNOSIS — J069 Acute upper respiratory infection, unspecified: Secondary | ICD-10-CM | POA: Diagnosis present

## 2017-02-12 DIAGNOSIS — R112 Nausea with vomiting, unspecified: Secondary | ICD-10-CM | POA: Diagnosis present

## 2017-02-12 DIAGNOSIS — E876 Hypokalemia: Secondary | ICD-10-CM | POA: Diagnosis present

## 2017-02-12 DIAGNOSIS — R509 Fever, unspecified: Secondary | ICD-10-CM | POA: Diagnosis present

## 2017-02-12 DIAGNOSIS — Z87891 Personal history of nicotine dependence: Secondary | ICD-10-CM

## 2017-02-12 DIAGNOSIS — N10 Acute tubulo-interstitial nephritis: Principal | ICD-10-CM | POA: Diagnosis present

## 2017-02-12 DIAGNOSIS — D649 Anemia, unspecified: Secondary | ICD-10-CM | POA: Diagnosis present

## 2017-02-12 DIAGNOSIS — N390 Urinary tract infection, site not specified: Secondary | ICD-10-CM | POA: Diagnosis present

## 2017-02-12 DIAGNOSIS — N12 Tubulo-interstitial nephritis, not specified as acute or chronic: Secondary | ICD-10-CM

## 2017-02-12 DIAGNOSIS — Z79899 Other long term (current) drug therapy: Secondary | ICD-10-CM

## 2017-02-12 HISTORY — DX: Acute pyelonephritis: N10

## 2017-02-12 HISTORY — DX: Fever, unspecified: R50.9

## 2017-02-12 LAB — URINALYSIS, ROUTINE W REFLEX MICROSCOPIC
Bilirubin Urine: NEGATIVE
Glucose, UA: NEGATIVE mg/dL
Ketones, ur: 5 mg/dL — AB
Nitrite: NEGATIVE
Protein, ur: 30 mg/dL — AB
SPECIFIC GRAVITY, URINE: 1.016 (ref 1.005–1.030)
pH: 5 (ref 5.0–8.0)

## 2017-02-12 LAB — CBC WITH DIFFERENTIAL/PLATELET
BASOS PCT: 0 %
Basophils Absolute: 0 10*3/uL (ref 0.0–0.1)
EOS ABS: 0 10*3/uL (ref 0.0–0.7)
Eosinophils Relative: 0 %
HCT: 34.9 % — ABNORMAL LOW (ref 36.0–46.0)
Hemoglobin: 11.2 g/dL — ABNORMAL LOW (ref 12.0–15.0)
Lymphocytes Relative: 4 %
Lymphs Abs: 0.4 10*3/uL — ABNORMAL LOW (ref 0.7–4.0)
MCH: 26 pg (ref 26.0–34.0)
MCHC: 32.1 g/dL (ref 30.0–36.0)
MCV: 81.2 fL (ref 78.0–100.0)
MONO ABS: 0.5 10*3/uL (ref 0.1–1.0)
MONOS PCT: 7 %
NEUTROS PCT: 89 %
Neutro Abs: 7.2 10*3/uL (ref 1.7–7.7)
Platelets: 204 10*3/uL (ref 150–400)
RBC: 4.3 MIL/uL (ref 3.87–5.11)
RDW: 14.3 % (ref 11.5–15.5)
WBC: 8.1 10*3/uL (ref 4.0–10.5)

## 2017-02-12 LAB — COMPREHENSIVE METABOLIC PANEL
ALK PHOS: 52 U/L (ref 38–126)
ALT: 21 U/L (ref 14–54)
AST: 20 U/L (ref 15–41)
Albumin: 3.6 g/dL (ref 3.5–5.0)
Anion gap: 13 (ref 5–15)
BILIRUBIN TOTAL: 2.1 mg/dL — AB (ref 0.3–1.2)
BUN: 6 mg/dL (ref 6–20)
CALCIUM: 8.7 mg/dL — AB (ref 8.9–10.3)
CO2: 22 mmol/L (ref 22–32)
CREATININE: 0.61 mg/dL (ref 0.44–1.00)
Chloride: 98 mmol/L — ABNORMAL LOW (ref 101–111)
GFR calc non Af Amer: >60 mL/min (ref >60)
GLUCOSE: 83 mg/dL (ref 65–99)
Potassium: 3.3 mmol/L — ABNORMAL LOW (ref 3.5–5.1)
Sodium: 133 mmol/L — ABNORMAL LOW (ref 135–145)
TOTAL PROTEIN: 7.1 g/dL (ref 6.5–8.1)

## 2017-02-12 LAB — I-STAT CG4 LACTIC ACID, ED
LACTIC ACID, VENOUS: 0.91 mmol/L (ref 0.5–1.9)
LACTIC ACID, VENOUS: 0.98 mmol/L (ref 0.5–1.9)

## 2017-02-12 LAB — POC URINE PREG, ED: PREG TEST UR: NEGATIVE

## 2017-02-12 MED ORDER — POTASSIUM CHLORIDE 2 MEQ/ML IV SOLN
30.0000 meq | Freq: Once | INTRAVENOUS | Status: AC
  Administered 2017-02-12: 30 meq via INTRAVENOUS
  Filled 2017-02-12: qty 15

## 2017-02-12 MED ORDER — ONDANSETRON 4 MG PO TBDP
4.0000 mg | ORAL_TABLET | Freq: Once | ORAL | Status: AC
  Administered 2017-02-12: 4 mg via ORAL

## 2017-02-12 MED ORDER — ONDANSETRON HCL 4 MG/2ML IJ SOLN
4.0000 mg | Freq: Once | INTRAMUSCULAR | Status: AC
  Administered 2017-02-12: 4 mg via INTRAVENOUS
  Filled 2017-02-12: qty 2

## 2017-02-12 MED ORDER — ONDANSETRON 4 MG PO TBDP
ORAL_TABLET | ORAL | Status: AC
  Filled 2017-02-12: qty 1

## 2017-02-12 MED ORDER — MORPHINE SULFATE (PF) 4 MG/ML IV SOLN
2.0000 mg | INTRAVENOUS | Status: DC | PRN
  Administered 2017-02-13: 2 mg via INTRAVENOUS
  Filled 2017-02-12: qty 1

## 2017-02-12 MED ORDER — MORPHINE SULFATE (PF) 4 MG/ML IV SOLN
4.0000 mg | Freq: Once | INTRAVENOUS | Status: AC
  Administered 2017-02-12: 4 mg via INTRAVENOUS
  Filled 2017-02-12: qty 1

## 2017-02-12 MED ORDER — DEXTROSE 5 % IV SOLN
1.0000 g | INTRAVENOUS | Status: DC
  Administered 2017-02-13: 1 g via INTRAVENOUS
  Filled 2017-02-12: qty 10

## 2017-02-12 MED ORDER — DEXTROSE 5 % IV SOLN
1.0000 g | Freq: Once | INTRAVENOUS | Status: AC
  Administered 2017-02-12: 1 g via INTRAVENOUS
  Filled 2017-02-12: qty 10

## 2017-02-12 MED ORDER — GUAIFENESIN ER 600 MG PO TB12
600.0000 mg | ORAL_TABLET | Freq: Two times a day (BID) | ORAL | Status: DC
  Administered 2017-02-13 – 2017-02-14 (×2): 600 mg via ORAL
  Filled 2017-02-12 (×3): qty 1

## 2017-02-12 MED ORDER — ALBUTEROL SULFATE (2.5 MG/3ML) 0.083% IN NEBU: 2.5000 mg | INHALATION_SOLUTION | RESPIRATORY_TRACT | Status: DC | PRN

## 2017-02-12 MED ORDER — ONDANSETRON HCL 4 MG/2ML IJ SOLN: 4.0000 mg | Freq: Four times a day (QID) | INTRAMUSCULAR | Status: DC | PRN

## 2017-02-12 MED ORDER — DIPHENHYDRAMINE HCL 50 MG/ML IJ SOLN
25.0000 mg | Freq: Once | INTRAMUSCULAR | Status: AC
  Administered 2017-02-12: 25 mg via INTRAVENOUS
  Filled 2017-02-12: qty 1

## 2017-02-12 MED ORDER — METOCLOPRAMIDE HCL 5 MG/ML IJ SOLN
10.0000 mg | Freq: Once | INTRAMUSCULAR | Status: AC
  Administered 2017-02-12: 10 mg via INTRAVENOUS
  Filled 2017-02-12: qty 2

## 2017-02-12 MED ORDER — ACETAMINOPHEN 650 MG RE SUPP: 650.0000 mg | Freq: Four times a day (QID) | RECTAL | Status: DC | PRN

## 2017-02-12 MED ORDER — SODIUM CHLORIDE 0.9 % IV BOLUS (SEPSIS)
1000.0000 mL | Freq: Once | INTRAVENOUS | Status: AC
  Administered 2017-02-12: 1000 mL via INTRAVENOUS

## 2017-02-12 MED ORDER — BOOST / RESOURCE BREEZE PO LIQD
1.0000 | Freq: Three times a day (TID) | ORAL | Status: DC
  Administered 2017-02-13: 1 via ORAL

## 2017-02-12 MED ORDER — ONDANSETRON HCL 4 MG PO TABS: 4.0000 mg | ORAL_TABLET | Freq: Four times a day (QID) | ORAL | Status: DC | PRN

## 2017-02-12 MED ORDER — ACETAMINOPHEN 325 MG PO TABS
650.0000 mg | ORAL_TABLET | Freq: Once | ORAL | Status: AC
  Administered 2017-02-12: 650 mg via ORAL
  Filled 2017-02-12: qty 2

## 2017-02-12 MED ORDER — ACETAMINOPHEN 325 MG PO TABS
650.0000 mg | ORAL_TABLET | Freq: Four times a day (QID) | ORAL | Status: DC | PRN
  Administered 2017-02-13 – 2017-02-14 (×3): 650 mg via ORAL
  Filled 2017-02-12 (×4): qty 2

## 2017-02-12 MED ORDER — SODIUM CHLORIDE 0.9 % IV SOLN
INTRAVENOUS | Status: DC
  Administered 2017-02-12 – 2017-02-13 (×2): via INTRAVENOUS

## 2017-02-12 MED ORDER — IOPAMIDOL (ISOVUE-300) INJECTION 61%
INTRAVENOUS | Status: AC
  Administered 2017-02-12: 100 mL
  Filled 2017-02-12: qty 100

## 2017-02-12 MED ORDER — ENOXAPARIN SODIUM 40 MG/0.4ML ~~LOC~~ SOLN
40.0000 mg | SUBCUTANEOUS | Status: DC
  Filled 2017-02-12 (×3): qty 0.4

## 2017-02-12 NOTE — ED Notes (Signed)
Patient transported to X-ray 

## 2017-02-12 NOTE — ED Notes (Signed)
Patient transported to CT 

## 2017-02-12 NOTE — ED Provider Notes (Signed)
[facility]-EMERGENCY DEPT Provider Note   CSN: [id number] Arrival date & time: 02/12/17  1651     History   Chief Complaint Chief Complaint  Patient presents with  . flu sx  . Flank Pain    HPI Kristen Garza is a 26 y.o. female.  The history is provided by the patient. No language interpreter was used.  Flank Pain    Kristen Garza is a 26 y.o. female who presents to the Emergency Department complaining of flank pain.  She reports 2 days of sharp right flank pain. She has associated fevers for the last 2 days. She endorses nausea and vomiting. No dysuria, diarrhea, shortness of breath, vaginal discharge. She has mild associated intermittent headache. No neck pain. She also reports mild cough. Symptoms are moderate, constant, worsening. Past Medical History:  Diagnosis Date  . Medical history non-contributory   . No pertinent past medical history     Patient Active Problem List   Diagnosis Date Noted  . UTI (urinary tract infection) 02/12/2017  . Acute pyelonephritis 02/12/2017  . Fever 02/12/2017  . URI (upper respiratory infection) 02/12/2017  . Nausea and vomiting 02/12/2017    Past Surgical History:  Procedure Laterality Date  . NO PAST SURGERIES      OB History    Gravida Para Term Preterm AB Living   3 2 2     2    SAB TAB Ectopic Multiple Live Births           2       Home Medications    Prior to Admission medications   Medication Sig Start Date End Date Taking? Authorizing Provider  acetaminophen (TYLENOL) 325 MG tablet Take 650 mg by mouth every 6 (six) hours as needed for mild pain.   Yes Historical Provider, MD  ibuprofen (ADVIL,MOTRIN) 800 MG tablet Take 1 tablet (800 mg total) by mouth 3 (three) times daily. Patient not taking: Reported on 02/12/2017 12/08/15   Rolm GalaStevi Barrett, PA-C  naproxen (NAPROSYN) 500 MG tablet Take 1 tablet (500 mg total) by mouth 2 (two) times daily. Patient not taking: Reported on 02/12/2017 08/23/15   Jerelyn ScottMartha Linker, MD    ondansetron (ZOFRAN) 4 MG tablet Take 1 tablet (4 mg total) by mouth every 6 (six) hours. Prn n/v. Patient not taking: Reported on 02/12/2017 01/25/17   Linna HoffJames D Kindl, MD  penicillin v potassium (VEETID) 500 MG tablet Take 1 tablet (500 mg total) by mouth 3 (three) times daily. Patient not taking: Reported on 02/12/2017 08/23/15   Jerelyn ScottMartha Linker, MD  traMADol (ULTRAM) 50 MG tablet Take 1 tablet (50 mg total) by mouth once. Patient not taking: Reported on 08/23/2015 07/16/15   Earley FavorGail Schulz, NP    Family History No family history on file.  Social History Social History  Substance Use Topics  . Smoking status: Former Smoker    Packs/day: 0.25    Years: 2.00    Types: Cigarettes  . Smokeless tobacco: Never Used  . Alcohol use No     Allergies   Patient has no known allergies.   Review of Systems Review of Systems  Genitourinary: Positive for flank pain.  All other systems reviewed and are negative.    Physical Exam Updated Vital Signs BP 120/68 (BP Location: Right Arm)   Pulse 94   Temp (!) 103.6 F (39.8 C) (Oral) Comment: RN Marquita PalmsMario and MD notified  Resp 18   Ht 5\' 5"  (1.651 m)   LMP 02/12/2017   SpO2  100%   Physical Exam  Constitutional: She is oriented to person, place, and time. She appears well-developed and well-nourished. She appears distressed.  HENT:  Head: Normocephalic and atraumatic.  Neck: Neck supple.  Cardiovascular: Normal rate and regular rhythm.   No murmur heard. Pulmonary/Chest: Effort normal and breath sounds normal. No respiratory distress.  Abdominal: Soft. There is no rebound and no guarding.  Mild right mid abdominal and right CVA tenderness  Musculoskeletal: She exhibits no edema or tenderness.  Neurological: She is alert and oriented to person, place, and time.  Skin: Skin is warm and dry.  Psychiatric: She has a normal mood and affect. Her behavior is normal.  Nursing note and vitals reviewed.    ED Treatments / Results  Labs (all labs  ordered are listed, but only abnormal results are displayed) Labs Reviewed  COMPREHENSIVE METABOLIC PANEL - Abnormal; Notable for the following:       Result Value   Sodium 133 (*)    Potassium 3.3 (*)    Chloride 98 (*)    Calcium 8.7 (*)    Total Bilirubin 2.1 (*)    All other components within normal limits  CBC WITH DIFFERENTIAL/PLATELET - Abnormal; Notable for the following:    Hemoglobin 11.2 (*)    HCT 34.9 (*)    Lymphs Abs 0.4 (*)    All other components within normal limits  URINALYSIS, ROUTINE W REFLEX MICROSCOPIC - Abnormal; Notable for the following:    APPearance HAZY (*)    Hgb urine dipstick LARGE (*)    Ketones, ur 5 (*)    Protein, ur 30 (*)    Leukocytes, UA MODERATE (*)    Bacteria, UA MANY (*)    Squamous Epithelial / LPF 0-5 (*)    All other components within normal limits  URINE CULTURE  RESPIRATORY PANEL BY PCR  CBC  BASIC METABOLIC PANEL  I-STAT CG4 LACTIC ACID, ED  POC URINE PREG, ED  I-STAT CG4 LACTIC ACID, ED    EKG  EKG Interpretation None       Radiology Ct Abdomen Pelvis W Contrast  Result Date: 02/12/2017 CLINICAL DATA:  Right lower quadrant pain with nausea vomiting for 2 days. EXAM: CT ABDOMEN AND PELVIS WITH CONTRAST TECHNIQUE: Multidetector CT imaging of the abdomen and pelvis was performed using the standard protocol following bolus administration of intravenous contrast. CONTRAST:  ISOVUE-300 IOPAMIDOL (ISOVUE-300) INJECTION 61% COMPARISON:  None. FINDINGS: Lower chest: No acute abnormality. Hepatobiliary: No focal liver abnormality is seen. No gallstones, gallbladder wall thickening, or biliary dilatation. Pancreas: Unremarkable. No pancreatic ductal dilatation or surrounding inflammatory changes. Spleen: Normal in size without focal abnormality. Adrenals/Urinary Tract: Adrenal glands are unremarkable. Kidneys are normal, without renal calculi, focal lesion, or hydronephrosis. Bladder is unremarkable. There is slight dilation and  hyper enhancement of the wall of the right ureter. No radiopaque calculus is seen. Stomach/Bowel: Stomach is within normal limits. Appendix appears normal. No evidence of bowel wall thickening, distention, or inflammatory changes. Vascular/Lymphatic: No significant vascular findings are present. No enlarged abdominal or pelvic lymph nodes. Reproductive: Uterus and bilateral adnexa are unremarkable. Other: No abdominal wall hernia or abnormality. No abdominopelvic ascites. Musculoskeletal: No acute or significant osseous findings. IMPRESSION: Slight dilation and hyper enhancement of the wall of the right ureter, without evidence of radiopaque ureteral calculus. This may represent infectious or inflammatory changes, post passing of ureteral calculus. Please correlate clinically. Normal appendix. Electronically Signed   By: Ted Mcalpine M.D.   On:  02/12/2017 19:38    Procedures Procedures (including critical care time)  Medications Ordered in ED Medications  potassium chloride 30 mEq in sodium chloride 0.9 % 265 mL (KCL MULTIRUN) IVPB (30 mEq Intravenous Given 02/12/17 2304)  enoxaparin (LOVENOX) injection 40 mg (not administered)  0.9 %  sodium chloride infusion ( Intravenous New Bag/Given 02/12/17 2315)  ondansetron (ZOFRAN) tablet 4 mg (not administered)    Or  ondansetron (ZOFRAN) injection 4 mg (not administered)  acetaminophen (TYLENOL) tablet 650 mg (not administered)    Or  acetaminophen (TYLENOL) suppository 650 mg (not administered)  albuterol (PROVENTIL) (2.5 MG/3ML) 0.083% nebulizer solution 2.5 mg (not administered)  guaiFENesin (MUCINEX) 12 hr tablet 600 mg (not administered)  morphine 4 MG/ML injection 2 mg (not administered)  cefTRIAXone (ROCEPHIN) 1 g in dextrose 5 % 50 mL IVPB (not administered)  ondansetron (ZOFRAN-ODT) disintegrating tablet 4 mg (4 mg Oral Given 02/12/17 1710)  acetaminophen (TYLENOL) tablet 650 mg (650 mg Oral Given 02/12/17 1747)  sodium chloride 0.9 %  bolus 1,000 mL (0 mLs Intravenous Stopped 02/12/17 2011)  morphine 4 MG/ML injection 4 mg (4 mg Intravenous Given 02/12/17 1858)  ondansetron (ZOFRAN) injection 4 mg (4 mg Intravenous Given 02/12/17 1857)  iopamidol (ISOVUE-300) 61 % injection (100 mLs  Contrast Given 02/12/17 1906)  cefTRIAXone (ROCEPHIN) 1 g in dextrose 5 % 50 mL IVPB (0 g Intravenous Stopped 02/12/17 2140)  metoCLOPramide (REGLAN) injection 10 mg (10 mg Intravenous Given 02/12/17 2155)  diphenhydrAMINE (BENADRYL) injection 25 mg (25 mg Intravenous Given 02/12/17 2157)  acetaminophen (TYLENOL) tablet 650 mg (650 mg Oral Given 02/12/17 2206)  ondansetron (ZOFRAN) injection 4 mg (4 mg Intravenous Given 02/12/17 2315)     Initial Impression / Assessment and Plan / ED Course  I have reviewed the triage vital signs and the nursing notes.  Pertinent labs & imaging results that were available during my care of the patient were reviewed by me and considered in my medical decision making (see chart for details).     Patient here for evaluation of right flank pain, fevers, vomiting. Patient is uncomfortable appearing on examination with mild right-sided abdominal tenderness. UA is concerning for UTI, will treat with antibiotics. CT abdomen with findings concerning for UTI versus recently passed kidney stone. Based on her symptoms and exam favor UTI with no evidence of stone. Patient with recurrent vomiting in the emergency department. Plan to admit for IV antibiotics and IV fluid hydration given her recurrent vomiting with pyelonephritis. Hospitalist consulted for admission and patient updated findings of studies.  Final Clinical Impressions(s) / ED Diagnoses   Final diagnoses:  Pyelonephritis    New Prescriptions New Prescriptions   No medications on file     Tilden Fossa, MD 02/12/17 2318

## 2017-02-12 NOTE — ED Triage Notes (Addendum)
Pt dx w flu at urgent care on 2/28-- has not gotten better, has been vomiting everything. c/o flank pain on right side-- states has had a kidney infection in past . Pt has dry cracked lips, looks sick.

## 2017-02-12 NOTE — H&P (Signed)
History and Physical    Kristen Garza ZOX:[id number] DOB: [birth year] DOA: 02/12/2017  Referring MD/NP/PA: Dr. Madilyn Hook PCP: No PCP Per Patient  Patient coming from: Home  Chief Complaint:  Cramps  HPI: Kristen Garza is a 26 y.o. female with medical history significant of recent influenza-like illness on 01/25/2017; who presents with complaints of right-sided cramps that are intermittent sharp for the last 2 days. Associated symptoms include nausea, vomiting, productive cough, subjective fevers, headache, achiness, and generalized malaise. Denies having any shortness of breath, dysuria, diarrhea, or blood in urine/stool. Patient notes that she had a flulike illness approximately 3 weeks ago but was not checked family for influenza at that time and sent home with ibuprofen.  ED Course: Upon admission into the emergency department patient was seen to be febrile up to 102.86F, pulse 78-99, respirations 14-22, and all other vitals maintained. Lab work revealed WBC 8.1, hemoglobin 11.2, sodium 133, potassium 3.3, chloride 98, total bilirubin 2.1, lactic acid 0.91, and all other labs relatively within normal limits. Urinalysis was positive for signs of infection. CT scan of the abdomen revealed right ureter inflammatory changes suggestive of possible  recently passed stone. Patient was given Zofran, Reglan, morphine, and IV ceftriaxone while in the ED.   Review of Systems: As per HPI otherwise 10 point review of systems negative.   Past Medical History:  Diagnosis Date  . Medical history non-contributory   . No pertinent past medical history     Past Surgical History:  Procedure Laterality Date  . NO PAST SURGERIES       reports that she has quit smoking. Her smoking use included Cigarettes. She has a 0.50 pack-year smoking history. She has never used smokeless tobacco. She reports that she does not drink alcohol or use drugs.  No Known Allergies  No family history on file.  Prior to  Admission medications   Medication Sig Start Date End Date Taking? Authorizing Provider  acetaminophen (TYLENOL) 325 MG tablet Take 650 mg by mouth every 6 (six) hours as needed for mild pain.   Yes Historical Provider, MD  ibuprofen (ADVIL,MOTRIN) 800 MG tablet Take 1 tablet (800 mg total) by mouth 3 (three) times daily. Patient not taking: Reported on 02/12/2017 12/08/15   Rolm Gala Barrett, PA-C  naproxen (NAPROSYN) 500 MG tablet Take 1 tablet (500 mg total) by mouth 2 (two) times daily. Patient not taking: Reported on 02/12/2017 08/23/15   Jerelyn Scott, MD  ondansetron (ZOFRAN) 4 MG tablet Take 1 tablet (4 mg total) by mouth every 6 (six) hours. Prn n/v. Patient not taking: Reported on 02/12/2017 01/25/17   Linna Hoff, MD  penicillin v potassium (VEETID) 500 MG tablet Take 1 tablet (500 mg total) by mouth 3 (three) times daily. Patient not taking: Reported on 02/12/2017 08/23/15   Jerelyn Scott, MD  traMADol (ULTRAM) 50 MG tablet Take 1 tablet (50 mg total) by mouth once. Patient not taking: Reported on 08/23/2015 07/16/15   Earley Favor, NP    Physical Exam:  Constitutional: Young female appears acutely sick able to follow commands. Vitals:   02/12/17 2000 02/12/17 2030 02/12/17 2100 02/12/17 2128  BP: 107/68 112/68 133/90 133/90  Pulse: 84 78 80 99  Resp: 14   18  Temp: (S) 99.1 F (37.3 C)   (!) 102.5 F (39.2 C)  TempSrc: Oral   Oral  SpO2: 100% 94% 100% 100%  Height:       Eyes: PERRL, lids and conjunctivae normal ENMT: Mucous membranes  are moist. Posterior pharynx clear of any exudate or lesions. Normal dentition.  Neck: normal, supple, no masses, no thyromegaly Respiratory: clear to auscultation bilaterally, no wheezing, no crackles. Normal respiratory effort. No accessory muscle use.  Cardiovascular: Regular rate and rhythm, no murmurs / rubs / gallops. No extremity edema. 2+ pedal pulses. No carotid bruits.  Abdomen: Right-sided CVA tenderness present, no masses palpated. No  hepatosplenomegaly. Bowel sounds positive.  Musculoskeletal: no clubbing / cyanosis. No joint deformity upper and lower extremities. Good ROM, no contractures. Normal muscle tone.  Skin: no rashes, lesions, ulcers. No induration Neurologic: CN 2-12 grossly intact. Sensation intact, DTR normal. Strength 5/5 in all 4.  Psychiatric: Normal judgment and insight. Alert and oriented x 3. Normal mood.     Labs on Admission: I have personally reviewed following labs and imaging studies  CBC:  Recent Labs Lab 02/12/17 1715  WBC 8.1  NEUTROABS 7.2  HGB 11.2*  HCT 34.9*  MCV 81.2  PLT 204   Basic Metabolic Panel:  Recent Labs Lab 02/12/17 1715  NA 133*  K 3.3*  CL 98*  CO2 22  GLUCOSE 83  BUN 6  CREATININE 0.61  CALCIUM 8.7*   GFR: CrCl cannot be calculated (Unknown ideal weight.). Liver Function Tests:  Recent Labs Lab 02/12/17 1715  AST 20  ALT 21  ALKPHOS 52  BILITOT 2.1*  PROT 7.1  ALBUMIN 3.6   No results for input(s): LIPASE, AMYLASE in the last 168 hours. No results for input(s): AMMONIA in the last 168 hours. Coagulation Profile: No results for input(s): INR, PROTIME in the last 168 hours. Cardiac Enzymes: No results for input(s): CKTOTAL, CKMB, CKMBINDEX, TROPONINI in the last 168 hours. BNP (last 3 results) No results for input(s): PROBNP in the last 8760 hours. HbA1C: No results for input(s): HGBA1C in the last 72 hours. CBG: No results for input(s): GLUCAP in the last 168 hours. Lipid Profile: No results for input(s): CHOL, HDL, LDLCALC, TRIG, CHOLHDL, LDLDIRECT in the last 72 hours. Thyroid Function Tests: No results for input(s): TSH, T4TOTAL, FREET4, T3FREE, THYROIDAB in the last 72 hours. Anemia Panel: No results for input(s): VITAMINB12, FOLATE, FERRITIN, TIBC, IRON, RETICCTPCT in the last 72 hours. Urine analysis:    Component Value Date/Time   COLORURINE YELLOW 02/12/2017 1725   APPEARANCEUR HAZY (A) 02/12/2017 1725   LABSPEC 1.016  02/12/2017 1725   PHURINE 5.0 02/12/2017 1725   GLUCOSEU NEGATIVE 02/12/2017 1725   HGBUR LARGE (A) 02/12/2017 1725   BILIRUBINUR NEGATIVE 02/12/2017 1725   KETONESUR 5 (A) 02/12/2017 1725   PROTEINUR 30 (A) 02/12/2017 1725   UROBILINOGEN 1.0 04/15/2014 1946   NITRITE NEGATIVE 02/12/2017 1725   LEUKOCYTESUR MODERATE (A) 02/12/2017 1725   Sepsis Labs: No results found for this or any previous visit (from the past 240 hour(s)).   Radiological Exams on Admission: Ct Abdomen Pelvis W Contrast  Result Date: 02/12/2017 CLINICAL DATA:  Right lower quadrant pain with nausea vomiting for 2 days. EXAM: CT ABDOMEN AND PELVIS WITH CONTRAST TECHNIQUE: Multidetector CT imaging of the abdomen and pelvis was performed using the standard protocol following bolus administration of intravenous contrast. CONTRAST:  ISOVUE-300 IOPAMIDOL (ISOVUE-300) INJECTION 61% COMPARISON:  None. FINDINGS: Lower chest: No acute abnormality. Hepatobiliary: No focal liver abnormality is seen. No gallstones, gallbladder wall thickening, or biliary dilatation. Pancreas: Unremarkable. No pancreatic ductal dilatation or surrounding inflammatory changes. Spleen: Normal in size without focal abnormality. Adrenals/Urinary Tract: Adrenal glands are unremarkable. Kidneys are normal, without renal calculi,  focal lesion, or hydronephrosis. Bladder is unremarkable. There is slight dilation and hyper enhancement of the wall of the right ureter. No radiopaque calculus is seen. Stomach/Bowel: Stomach is within normal limits. Appendix appears normal. No evidence of bowel wall thickening, distention, or inflammatory changes. Vascular/Lymphatic: No significant vascular findings are present. No enlarged abdominal or pelvic lymph nodes. Reproductive: Uterus and bilateral adnexa are unremarkable. Other: No abdominal wall hernia or abnormality. No abdominopelvic ascites. Musculoskeletal: No acute or significant osseous findings. IMPRESSION: Slight  dilation and hyper enhancement of the wall of the right ureter, without evidence of radiopaque ureteral calculus. This may represent infectious or inflammatory changes, post passing of ureteral calculus. Please correlate clinically. Normal appendix. Electronically Signed   By: Ted Mcalpineobrinka  Dimitrova M.D.   On: 02/12/2017 19:38      Assessment/Plan Suspected pyelonephritis: Acute. Patient presents with right flank pain and fevers. Urinalysis positive for signs of infection. CT scan showing inflammation of the right upper ureter. Patient empirically started on Rocephin. - Admit to MedSurg bed - Follow-up urine culture - Continue Rocephin  Fevers: Acute. Patient with fevers up to 102.70F here in the ED - Tylenol prn  Nausea and vomiting: Acute. - Zofran prn N/V  - IVF NS at 100 ml/hr - Clear liquid diet with orders written to advance as tolerated to regular diet   Upper respiratory infection:Previously diagnosed with influenza on 2/28. Patient reports continued cough. - Check chest x-ray - Mucinex - Respiratory viral panel   Anemia: Hemoglobin 11.2 on admission. No acute reports bleeding. - Recheck CBC in a.m.  Hypokalemia: Acute. Initial potassium 3.3 on admission suspect secondary to nausea and vomiting. -  potassium chloride 30 mEq IV x1 dose - Continue to monitor and replace as needed   DVT prophylaxis: lovenox Code Status: Full  Family Communication:  No family present at bedside. Disposition Plan: Likely discharge home in 1-2 days Consults called: None  Admission status: Observation  Clydie Braunondell A Blayden Conwell MD [facility] Pager [phone]  If 7PM-7AM, please contact night-coverage [email or web address] Password [email or web address]  02/12/2017, 10:04 PM

## 2017-02-12 NOTE — Progress Notes (Signed)
Pharmacy Antibiotic Note  Kristen Garza is a 26 y.o. female admitted on 02/12/2017 with pyelonephritis.  Pharmacy has been consulted for ceftriaxone dosing - received ceftriaxone 1g IV x 1 dose in the ED 2110.  Plan: Ceftriaxone 1g IV q24h - to start tomorrow at 2100 F/u clinical progress, LOT  Height: 5\' 5"  (165.1 cm) IBW/kg (Calculated) : 57  Temp (24hrs), Avg:100.7 F (38.2 C), Min:99.1 F (37.3 C), Max:102.5 F (39.2 C)   Recent Labs Lab 02/12/17 1715 02/12/17 1726 02/12/17 2042  WBC 8.1  --   --   CREATININE 0.61  --   --   LATICACIDVEN  --  0.91 0.98    CrCl cannot be calculated (Unknown ideal weight.).    No Known Allergies    Kristen Garza, PharmD, BCPS Clinical Pharmacist 02/12/2017 10:39 PM

## 2017-02-13 ENCOUNTER — Encounter (HOSPITAL_COMMUNITY): Payer: Self-pay

## 2017-02-13 ENCOUNTER — Observation Stay (HOSPITAL_COMMUNITY): Payer: Self-pay

## 2017-02-13 DIAGNOSIS — R112 Nausea with vomiting, unspecified: Secondary | ICD-10-CM

## 2017-02-13 DIAGNOSIS — N10 Acute tubulo-interstitial nephritis: Principal | ICD-10-CM

## 2017-02-13 DIAGNOSIS — R509 Fever, unspecified: Secondary | ICD-10-CM

## 2017-02-13 LAB — RESPIRATORY PANEL BY PCR
ADENOVIRUS-RVPPCR: NOT DETECTED
Bordetella pertussis: NOT DETECTED
CHLAMYDOPHILA PNEUMONIAE-RVPPCR: NOT DETECTED
CORONAVIRUS HKU1-RVPPCR: NOT DETECTED
CORONAVIRUS NL63-RVPPCR: NOT DETECTED
CORONAVIRUS OC43-RVPPCR: NOT DETECTED
Coronavirus 229E: NOT DETECTED
Influenza A: NOT DETECTED
Influenza B: NOT DETECTED
MYCOPLASMA PNEUMONIAE-RVPPCR: NOT DETECTED
Metapneumovirus: NOT DETECTED
PARAINFLUENZA VIRUS 1-RVPPCR: NOT DETECTED
PARAINFLUENZA VIRUS 3-RVPPCR: NOT DETECTED
Parainfluenza Virus 2: NOT DETECTED
Parainfluenza Virus 4: NOT DETECTED
Respiratory Syncytial Virus: NOT DETECTED
Rhinovirus / Enterovirus: NOT DETECTED

## 2017-02-13 LAB — BASIC METABOLIC PANEL
ANION GAP: 9 (ref 5–15)
BUN: 5 mg/dL — ABNORMAL LOW (ref 6–20)
CHLORIDE: 108 mmol/L (ref 101–111)
CO2: 21 mmol/L — AB (ref 22–32)
CREATININE: 0.67 mg/dL (ref 0.44–1.00)
Calcium: 8 mg/dL — ABNORMAL LOW (ref 8.9–10.3)
Glucose, Bld: 80 mg/dL (ref 65–99)
POTASSIUM: 3.6 mmol/L (ref 3.5–5.1)
SODIUM: 138 mmol/L (ref 135–145)

## 2017-02-13 LAB — CBC
HEMATOCRIT: 30.9 % — AB (ref 36.0–46.0)
HEMOGLOBIN: 9.7 g/dL — AB (ref 12.0–15.0)
MCH: 25.7 pg — ABNORMAL LOW (ref 26.0–34.0)
MCHC: 31.4 g/dL (ref 30.0–36.0)
MCV: 82 fL (ref 78.0–100.0)
Platelets: 173 10*3/uL (ref 150–400)
RBC: 3.77 MIL/uL — AB (ref 3.87–5.11)
RDW: 14.1 % (ref 11.5–15.5)
WBC: 7.7 10*3/uL (ref 4.0–10.5)

## 2017-02-13 NOTE — Progress Notes (Signed)
PROGRESS NOTE    Kristen Garza  [id number] DOB: January 02, 1991 DOA: 02/12/2017 PCP: No PCP Per Patient   Outpatient Specialists:     Brief Narrative:  Kristen Garza is a 26 y.o. female with medical history significant of recent influenza-like illness on 01/25/2017; who presents with complaints of right-sided cramps that are intermittent sharp for the last 2 days. Associated symptoms include nausea, vomiting, productive cough, subjective fevers, headache, achiness, and generalized malaise. Denies having any shortness of breath, dysuria, diarrhea, or blood in urine/stool. Patient notes that she had a flulike illness approximately 3 weeks ago but was not checked family for influenza at that time and sent home with ibuprofen.    Assessment & Plan:   Principal Problem:   Acute pyelonephritis Active Problems:   Fever   URI (upper respiratory infection)   Nausea and vomiting   Suspected pyelonephritis: Acute.  - right flank pain and fevers.  - Follow-up urine culture - Continue Rocephin  Fevers: Acute. Patient with fevers up to 102.50F here in the ED - Tylenol prn  Nausea and vomiting: Acute. - Zofran prn N/V  - resolved  Upper respiratory infection:Previously diagnosed with influenza on 2/28. Patient reports continued cough. - chest x-ray no sign of fluid/infection - Mucinex - Respiratory viral panel negative  Anemia: Hemoglobin 11.2 on admission but dropped with IVF - suspect Fedef in this menstruating female  Hypokalemia: Acute. Initial potassium 3.3 on admission suspect secondary to nausea and vomiting. -  potassium chloride 30 mEq IV x1 dose - Continue to monitor and replace as needed    DVT prophylaxis:  Lovenox   Code Status: Full Code   Family Communication:   Disposition Plan:     Consultants:    Subjective: Still not feeling well  Objective: Vitals:   02/12/17 2240 02/12/17 2314 02/13/17 0034 02/13/17 0530  BP: 120/68 111/60  113/65 129/72  Pulse: 94 98 87 78  Resp: 18 14 18 18   Temp: (!) 103.6 F (39.8 C) (!) 103.1 F (39.5 C) (!) 101.7 F (38.7 C) (!) 100.6 F (38.1 C)  TempSrc: Oral Oral Oral Oral  SpO2: 100% 99% 100% 100%  Height:        Intake/Output Summary (Last 24 hours) at 02/13/17 1339 Last data filed at 02/13/17 4401  Gross per 24 hour  Intake          1918.33 ml  Output                0 ml  Net          1918.33 ml   There were no vitals filed for this visit.  Examination:  General exam: ill appearing Respiratory system: Clear to auscultation. Respiratory effort normal. Cardiovascular system: S1 & S2 heard, RRR. No JVD, murmurs, rubs, gallops or clicks. No pedal edema. Gastrointestinal system: +BS, + CVA tenderness     Data Reviewed: I have personally reviewed following labs and imaging studies  CBC:  Recent Labs Lab 02/12/17 1715 02/13/17 0431  WBC 8.1 7.7  NEUTROABS 7.2  --   HGB 11.2* 9.7*  HCT 34.9* 30.9*  MCV 81.2 82.0  PLT 204 173   Basic Metabolic Panel:  Recent Labs Lab 02/12/17 1715 02/13/17 0431  NA 133* 138  K 3.3* 3.6  CL 98* 108  CO2 22 21*  GLUCOSE 83 80  BUN 6 5*  CREATININE 0.61 0.67  CALCIUM 8.7* 8.0*   GFR: CrCl cannot be calculated (Unknown ideal weight.). Liver Function Tests:  Recent Labs Lab 02/12/17 1715  AST 20  ALT 21  ALKPHOS 52  BILITOT 2.1*  PROT 7.1  ALBUMIN 3.6   No results for input(s): LIPASE, AMYLASE in the last 168 hours. No results for input(s): AMMONIA in the last 168 hours. Coagulation Profile: No results for input(s): INR, PROTIME in the last 168 hours. Cardiac Enzymes: No results for input(s): CKTOTAL, CKMB, CKMBINDEX, TROPONINI in the last 168 hours. BNP (last 3 results) No results for input(s): PROBNP in the last 8760 hours. HbA1C: No results for input(s): HGBA1C in the last 72 hours. CBG: No results for input(s): GLUCAP in the last 168 hours. Lipid Profile: No results for input(s): CHOL, HDL,  LDLCALC, TRIG, CHOLHDL, LDLDIRECT in the last 72 hours. Thyroid Function Tests: No results for input(s): TSH, T4TOTAL, FREET4, T3FREE, THYROIDAB in the last 72 hours. Anemia Panel: No results for input(s): VITAMINB12, FOLATE, FERRITIN, TIBC, IRON, RETICCTPCT in the last 72 hours. Urine analysis:    Component Value Date/Time   COLORURINE YELLOW 02/12/2017 1725   APPEARANCEUR HAZY (A) 02/12/2017 1725   LABSPEC 1.016 02/12/2017 1725   PHURINE 5.0 02/12/2017 1725   GLUCOSEU NEGATIVE 02/12/2017 1725   HGBUR LARGE (A) 02/12/2017 1725   BILIRUBINUR NEGATIVE 02/12/2017 1725   KETONESUR 5 (A) 02/12/2017 1725   PROTEINUR 30 (A) 02/12/2017 1725   UROBILINOGEN 1.0 04/15/2014 1946   NITRITE NEGATIVE 02/12/2017 1725   LEUKOCYTESUR MODERATE (A) 02/12/2017 1725     ) Recent Results (from the past 240 hour(s))  Respiratory Panel by PCR     Status: None   Collection Time: 02/13/17  6:17 AM  Result Value Ref Range Status   Adenovirus NOT DETECTED NOT DETECTED Final   Coronavirus 229E NOT DETECTED NOT DETECTED Final   Coronavirus HKU1 NOT DETECTED NOT DETECTED Final   Coronavirus NL63 NOT DETECTED NOT DETECTED Final   Coronavirus OC43 NOT DETECTED NOT DETECTED Final   Metapneumovirus NOT DETECTED NOT DETECTED Final   Rhinovirus / Enterovirus NOT DETECTED NOT DETECTED Final   Influenza A NOT DETECTED NOT DETECTED Final   Influenza B NOT DETECTED NOT DETECTED Final   Parainfluenza Virus 1 NOT DETECTED NOT DETECTED Final   Parainfluenza Virus 2 NOT DETECTED NOT DETECTED Final   Parainfluenza Virus 3 NOT DETECTED NOT DETECTED Final   Parainfluenza Virus 4 NOT DETECTED NOT DETECTED Final   Respiratory Syncytial Virus NOT DETECTED NOT DETECTED Final   Bordetella pertussis NOT DETECTED NOT DETECTED Final   Chlamydophila pneumoniae NOT DETECTED NOT DETECTED Final   Mycoplasma pneumoniae NOT DETECTED NOT DETECTED Final      Anti-infectives    Start     Dose/Rate Route Frequency Ordered Stop    02/13/17 2100  cefTRIAXone (ROCEPHIN) 1 g in dextrose 5 % 50 mL IVPB     1 g 100 mL/hr over 30 Minutes Intravenous Every 24 hours 02/12/17 2240     02/12/17 2100  cefTRIAXone (ROCEPHIN) 1 g in dextrose 5 % 50 mL IVPB     1 g 100 mL/hr over 30 Minutes Intravenous  Once 02/12/17 2053 02/12/17 2140       Radiology Studies: Dg Chest 2 View  Result Date: 02/13/2017 CLINICAL DATA:  Fever and cough for 1 day EXAM: CHEST  2 VIEW COMPARISON:  None. FINDINGS: Lungs are clear. Heart size and pulmonary vascularity are normal. No adenopathy. No pneumothorax. No bone lesions. IMPRESSION: No edema or consolidation. Electronically Signed   By: Bretta Bang III M.D.   On: 02/13/2017  08:23   Ct Abdomen Pelvis W Contrast  Result Date: 02/12/2017 CLINICAL DATA:  Right lower quadrant pain with nausea vomiting for 2 days. EXAM: CT ABDOMEN AND PELVIS WITH CONTRAST TECHNIQUE: Multidetector CT imaging of the abdomen and pelvis was performed using the standard protocol following bolus administration of intravenous contrast. CONTRAST:  100mL ISOVUE-300 IOPAMIDOL (ISOVUE-300) INJECTION 61% COMPARISON:  None. FINDINGS: Lower chest: No acute abnormality. Hepatobiliary: No focal liver abnormality is seen. No gallstones, gallbladder wall thickening, or biliary dilatation. Pancreas: Unremarkable. No pancreatic ductal dilatation or surrounding inflammatory changes. Spleen: Normal in size without focal abnormality. Adrenals/Urinary Tract: Adrenal glands are unremarkable. Kidneys are normal, without renal calculi, focal lesion, or hydronephrosis. Bladder is unremarkable. There is slight dilation and hyper enhancement of the wall of the right ureter. No radiopaque calculus is seen. Stomach/Bowel: Stomach is within normal limits. Appendix appears normal. No evidence of bowel wall thickening, distention, or inflammatory changes. Vascular/Lymphatic: No significant vascular findings are present. No enlarged abdominal or pelvic lymph  nodes. Reproductive: Uterus and bilateral adnexa are unremarkable. Other: No abdominal wall hernia or abnormality. No abdominopelvic ascites. Musculoskeletal: No acute or significant osseous findings. IMPRESSION: Slight dilation and hyper enhancement of the wall of the right ureter, without evidence of radiopaque ureteral calculus. This may represent infectious or inflammatory changes, post passing of ureteral calculus. Please correlate clinically. Normal appendix. Electronically Signed   By: Ted Mcalpineobrinka  Dimitrova M.D.   On: 02/12/2017 19:38        Scheduled Meds: . cefTRIAXone (ROCEPHIN)  IV  1 g Intravenous Q24H  . enoxaparin (LOVENOX) injection  40 mg Subcutaneous Q24H  . feeding supplement  1 Container Oral TID BM  . guaiFENesin  600 mg Oral BID   Continuous Infusions: . sodium chloride 100 mL/hr at 02/13/17 1100     LOS: 0 days    Time spent: 25 min    Mardi Cannady U Cobin Cadavid, DO [facility] Pager [phone]  If 7PM-7AM, please contact night-coverage [email or web address] Password [email or web address] 02/13/2017, 1:39 PM

## 2017-02-13 NOTE — Progress Notes (Signed)
[name] female 26 y.o arrived to the unit via bed from the Emergency department.  Patient is alert and oriented x4.  No complaints of pain.  [facility]using @ 17000ml/hr with potassium going as well.  Skin assessment complete.  Excoriated noted on lower abdomen and buttocks bilaterally.  Educated the patient on how to reach the staff.  Activated the bed alarm due to the patient being very weak and unsteady gait. Lowered the bed and placed the call light within reach.  Will continue to monitor the patient

## 2017-02-14 LAB — URINE CULTURE: Culture: >=100000 — AB

## 2017-02-14 MED ORDER — CEPHALEXIN 500 MG PO CAPS: 500.0000 mg | ORAL_CAPSULE | Freq: Two times a day (BID) | ORAL | 0 refills | Status: DC

## 2017-02-14 MED ORDER — IBUPROFEN 800 MG PO TABS: 800.0000 mg | ORAL_TABLET | Freq: Four times a day (QID) | ORAL | 0 refills | Status: DC | PRN

## 2017-02-14 MED ORDER — CEPHALEXIN 500 MG PO CAPS
500.0000 mg | ORAL_CAPSULE | Freq: Two times a day (BID) | ORAL | Status: DC
  Administered 2017-02-14: 500 mg via ORAL
  Filled 2017-02-14: qty 1

## 2017-02-14 NOTE — Care Management Note (Addendum)
Case Management Note  Patient Details  Name: Kristen Garza MRN: [id number] Date of Birth: 1991/10/26  Subjective/Objective:    Admitted with Acute pyelonephritis.              Action/Plan: Plan is to discharge to home today. [facility] with [facility] and [facility] and shared hospital follow up appt. Pt will arrange future appointment @ that time for PCP with clinic.  Expected Discharge Date:  02/14/17               Expected Discharge Plan:  Home/Self Care  In-House Referral:     Discharge planning Services  CM Consult, [facility]t [facility], post hospital follow appointment - 3/22//2018 @ 11am)   Status of Service:  Completed, signed off  If discussed at Long Length of Stay Meetings, dates discussed:    Additional Comments: CM gave and explained coupon for Keflex 500mg , 20 ct for [facility]  Pharmacy with pt cost @ $ 8.00.    Gae GallopCole, Dylin Breeden HayesHudson, RN 02/14/2017, 11:14 AM

## 2017-02-14 NOTE — Progress Notes (Signed)
Discharge instructions (including medications) discussed with and copy provided to patient/caregiver 

## 2017-02-14 NOTE — Discharge Summary (Addendum)
Physician Discharge Summary  Kristen Garza ZOX:[id number] DOB: 1991-06-04 DOA: 02/12/2017  PCP: No PCP Per Patient  Admit date: 02/12/2017 Discharge date: 02/14/2017   Recommendations for Outpatient Follow-Up:   1. Close outpatient follow up to ensure resolution of infection-- care manager to arrange follow up 2. CBC as outpatient 2 weeks   Discharge Diagnosis:   Principal Problem:   Acute pyelonephritis Active Problems:   UTI (urinary tract infection)   Fever   URI (upper respiratory infection)   Nausea and vomiting   Discharge disposition:  Home.   Discharge Condition: Improved.  Diet recommendation: Regular.  Wound care: None.   History of Present Illness:   Kristen Garza is a 26 y.o. female with medical history significant of recent influenza-like illness on 01/25/2017; who presents with complaints of right-sided cramps that are intermittent sharp for the last 2 days. Associated symptoms include nausea, vomiting, productive cough, subjective fevers, headache, achiness, and generalized malaise. Denies having any shortness of breath, dysuria, diarrhea, or blood in urine/stool. Patient notes that she had a flulike illness approximately 3 weeks ago but was not checked family for influenza at that time and sent home with ibuprofen.   Hospital Course by Problem:    pyelonephritis:Acute.  - right flank pain and fevers.  - urine culture shows e coli- pan sensitive -  Rocephin- change to PO keflex ($4 at [facility]) Will treat for 10 days  Fevers: Acute - Tylenol prn  Nausea and vomiting: due to infection.  - resolved  Upper respiratory infection:Previously diagnosed with influenza on 2/28. Patient reports continued cough. - chest x-ray no sign of fluid/infection - Mucinex - Respiratory viral panel negative  Anemia: Hemoglobin 11.2 on admission but dropped with IVF - suspect Fedef in this menstruating female -outpatient follow  up  Hypokalemia:Acute. Initial potassium 3.3 on admission suspect secondary to nausea and vomiting. - potassium chloride 30 mEq IV x1 dose - Continue to monitor and replace as needed    Medical Consultants:    None.   Discharge Exam:   Vitals:   02/14/17 0014 02/14/17 0601  BP:  119/70  Pulse:  82  Resp:  18  Temp: 98.8 F (37.1 C) (!) 100.4 F (38 C)   Vitals:   02/13/17 1800 02/13/17 2232 02/14/17 0014 02/14/17 0601  BP:  (!) 142/67  119/70  Pulse:  83  82  Resp:  17  18  Temp:  (!) 102.5 F (39.2 C) 98.8 F (37.1 C) (!) 100.4 F (38 C)  TempSrc:   Oral Oral  SpO2:  99%  100%  Weight: 64 kg (141 lb)     Height: 5\' 5"  (1.651 m)       Gen:  NAD- feeling much better   The results of significant diagnostics from this hospitalization (including imaging, microbiology, ancillary and laboratory) are listed below for reference.     Procedures and Diagnostic Studies:   Dg Chest 2 View  Result Date: 02/13/2017 CLINICAL DATA:  Fever and cough for 1 day EXAM: CHEST  2 VIEW COMPARISON:  None. FINDINGS: Lungs are clear. Heart size and pulmonary vascularity are normal. No adenopathy. No pneumothorax. No bone lesions. IMPRESSION: No edema or consolidation. Electronically Signed   By: Bretta Bang III M.D.   On: 02/13/2017 08:23   Ct Abdomen Pelvis W Contrast  Result Date: 02/12/2017 CLINICAL DATA:  Right lower quadrant pain with nausea vomiting for 2 days. EXAM: CT ABDOMEN AND PELVIS WITH CONTRAST TECHNIQUE: Multidetector CT imaging  of the abdomen and pelvis was performed using the standard protocol following bolus administration of intravenous contrast. CONTRAST:  ISOVUE-300 IOPAMIDOL (ISOVUE-300) INJECTION 61% COMPARISON:  None. FINDINGS: Lower chest: No acute abnormality. Hepatobiliary: No focal liver abnormality is seen. No gallstones, gallbladder wall thickening, or biliary dilatation. Pancreas: Unremarkable. No pancreatic ductal dilatation or surrounding  inflammatory changes. Spleen: Normal in size without focal abnormality. Adrenals/Urinary Tract: Adrenal glands are unremarkable. Kidneys are normal, without renal calculi, focal lesion, or hydronephrosis. Bladder is unremarkable. There is slight dilation and hyper enhancement of the wall of the right ureter. No radiopaque calculus is seen. Stomach/Bowel: Stomach is within normal limits. Appendix appears normal. No evidence of bowel wall thickening, distention, or inflammatory changes. Vascular/Lymphatic: No significant vascular findings are present. No enlarged abdominal or pelvic lymph nodes. Reproductive: Uterus and bilateral adnexa are unremarkable. Other: No abdominal wall hernia or abnormality. No abdominopelvic ascites. Musculoskeletal: No acute or significant osseous findings. IMPRESSION: Slight dilation and hyper enhancement of the wall of the right ureter, without evidence of radiopaque ureteral calculus. This may represent infectious or inflammatory changes, post passing of ureteral calculus. Please correlate clinically. Normal appendix. Electronically Signed   By: Ted Mcalpine M.D.   On: 02/12/2017 19:38     Labs:   Basic Metabolic Panel:  Recent Labs Lab 02/12/17 1715 02/13/17 0431  NA 133* 138  K 3.3* 3.6  CL 98* 108  CO2 22 21*  GLUCOSE 83 80  BUN 6 5*  CREATININE 0.61 0.67  CALCIUM 8.7* 8.0*   GFR Estimated Creatinine Clearance: 95 mL/min (by C-G formula based on SCr of 0.67 mg/dL). Liver Function Tests:  Recent Labs Lab 02/12/17 1715  AST 20  ALT 21  ALKPHOS 52  BILITOT 2.1*  PROT 7.1  ALBUMIN 3.6   No results for input(s): LIPASE, AMYLASE in the last 168 hours. No results for input(s): AMMONIA in the last 168 hours. Coagulation profile No results for input(s): INR, PROTIME in the last 168 hours.  CBC:  Recent Labs Lab 02/12/17 1715 02/13/17 0431  WBC 8.1 7.7  NEUTROABS 7.2  --   HGB 11.2* 9.7*  HCT 34.9* 30.9*  MCV 81.2 82.0  PLT 204 173    Cardiac Enzymes: No results for input(s): CKTOTAL, CKMB, CKMBINDEX, TROPONINI in the last 168 hours. BNP: Invalid input(s): POCBNP CBG: No results for input(s): GLUCAP in the last 168 hours. D-Dimer No results for input(s): DDIMER in the last 72 hours. Hgb A1c No results for input(s): HGBA1C in the last 72 hours. Lipid Profile No results for input(s): CHOL, HDL, LDLCALC, TRIG, CHOLHDL, LDLDIRECT in the last 72 hours. Thyroid function studies No results for input(s): TSH, T4TOTAL, T3FREE, THYROIDAB in the last 72 hours.  Invalid input(s): FREET3 Anemia work up No results for input(s): VITAMINB12, FOLATE, FERRITIN, TIBC, IRON, RETICCTPCT in the last 72 hours. Microbiology Recent Results (from the past 240 hour(s))  Urine culture     Status: Abnormal   Collection Time: 02/12/17  6:41 PM  Result Value Ref Range Status   Specimen Description URINE, RANDOM  Final   Special Requests NONE  Final   Culture >=100,000 COLONIES/mL ESCHERICHIA COLI (A)  Final   Report Status 02/14/2017 FINAL  Final   Organism ID, Bacteria ESCHERICHIA COLI (A)  Final      Susceptibility   Escherichia coli - MIC*    AMPICILLIN <=2 SENSITIVE Sensitive     CEFAZOLIN <=4 SENSITIVE Sensitive     CEFTRIAXONE <=1 SENSITIVE Sensitive  CIPROFLOXACIN <=0.25 SENSITIVE Sensitive     GENTAMICIN <=1 SENSITIVE Sensitive     IMIPENEM <=0.25 SENSITIVE Sensitive     NITROFURANTOIN <=16 SENSITIVE Sensitive     TRIMETH/SULFA <=20 SENSITIVE Sensitive     AMPICILLIN/SULBACTAM <=2 SENSITIVE Sensitive     PIP/TAZO <=4 SENSITIVE Sensitive     Extended ESBL NEGATIVE Sensitive     * >=100,000 COLONIES/mL ESCHERICHIA COLI  Respiratory Panel by PCR     Status: None   Collection Time: 02/13/17  6:17 AM  Result Value Ref Range Status   Adenovirus NOT DETECTED NOT DETECTED Final   Coronavirus 229E NOT DETECTED NOT DETECTED Final   Coronavirus HKU1 NOT DETECTED NOT DETECTED Final   Coronavirus NL63 NOT DETECTED NOT DETECTED  Final   Coronavirus OC43 NOT DETECTED NOT DETECTED Final   Metapneumovirus NOT DETECTED NOT DETECTED Final   Rhinovirus / Enterovirus NOT DETECTED NOT DETECTED Final   Influenza A NOT DETECTED NOT DETECTED Final   Influenza B NOT DETECTED NOT DETECTED Final   Parainfluenza Virus 1 NOT DETECTED NOT DETECTED Final   Parainfluenza Virus 2 NOT DETECTED NOT DETECTED Final   Parainfluenza Virus 3 NOT DETECTED NOT DETECTED Final   Parainfluenza Virus 4 NOT DETECTED NOT DETECTED Final   Respiratory Syncytial Virus NOT DETECTED NOT DETECTED Final   Bordetella pertussis NOT DETECTED NOT DETECTED Final   Chlamydophila pneumoniae NOT DETECTED NOT DETECTED Final   Mycoplasma pneumoniae NOT DETECTED NOT DETECTED Final     Discharge Instructions:   Discharge Instructions    Diet general    Complete by:  As directed    Increase activity slowly    Complete by:  As directed      Allergies as of 02/14/2017   No Known Allergies     Medication List    STOP taking these medications   naproxen 500 MG tablet Commonly known as:  NAPROSYN   ondansetron 4 MG tablet Commonly known as:  ZOFRAN   penicillin v potassium 500 MG tablet Commonly known as:  VEETID   traMADol 50 MG tablet Commonly known as:  ULTRAM     TAKE these medications   acetaminophen 325 MG tablet Commonly known as:  TYLENOL Take 650 mg by mouth every 6 (six) hours as needed for mild pain.   cephALEXin 500 MG capsule Commonly known as:  KEFLEX Take 1 capsule (500 mg total) by mouth every 12 (twelve) hours.   ibuprofen 800 MG tablet Commonly known as:  ADVIL,MOTRIN Take 1 tablet (800 mg total) by mouth every 6 (six) hours as needed. What changed:  when to take this  reasons to take this         Time coordinating discharge: 35 min  Signed:  Jorgeluis Gurganus U Tonetta Napoles   [facility] 02/14/2017, 10:24 AM

## 2017-02-16 ENCOUNTER — Inpatient Hospital Stay: Payer: Medicaid Other

## 2017-10-09 ENCOUNTER — Other Ambulatory Visit: Payer: Self-pay

## 2017-10-09 ENCOUNTER — Emergency Department (HOSPITAL_COMMUNITY)
Admission: EM | Admit: 2017-10-09 | Discharge: 2017-10-09 | Disposition: A | Discharge status: Home / Self Care | Payer: Self-pay | Attending: Emergency Medicine | Admitting: Emergency Medicine

## 2017-10-09 ENCOUNTER — Encounter (HOSPITAL_COMMUNITY): Payer: Self-pay | Admitting: Emergency Medicine

## 2017-10-09 ENCOUNTER — Emergency Department (HOSPITAL_COMMUNITY): Payer: Self-pay

## 2017-10-09 DIAGNOSIS — N3000 Acute cystitis without hematuria: Secondary | ICD-10-CM | POA: Insufficient documentation

## 2017-10-09 DIAGNOSIS — Z79899 Other long term (current) drug therapy: Secondary | ICD-10-CM | POA: Insufficient documentation

## 2017-10-09 DIAGNOSIS — K8020 Calculus of gallbladder without cholecystitis without obstruction: Secondary | ICD-10-CM | POA: Insufficient documentation

## 2017-10-09 DIAGNOSIS — R109 Unspecified abdominal pain: Secondary | ICD-10-CM

## 2017-10-09 DIAGNOSIS — Z87891 Personal history of nicotine dependence: Secondary | ICD-10-CM | POA: Insufficient documentation

## 2017-10-09 LAB — URINALYSIS, ROUTINE W REFLEX MICROSCOPIC
BILIRUBIN URINE: NEGATIVE
Glucose, UA: NEGATIVE mg/dL
Ketones, ur: NEGATIVE mg/dL
Nitrite: POSITIVE — AB
PROTEIN: 30 mg/dL — AB
SPECIFIC GRAVITY, URINE: 1.024 (ref 1.005–1.030)
pH: 6 (ref 5.0–8.0)

## 2017-10-09 LAB — COMPREHENSIVE METABOLIC PANEL
ALT: 15 U/L (ref 14–54)
AST: 19 U/L (ref 15–41)
Albumin: 4.2 g/dL (ref 3.5–5.0)
Alkaline Phosphatase: 50 U/L (ref 38–126)
Anion gap: 8 (ref 5–15)
BUN: 7 mg/dL (ref 6–20)
CHLORIDE: 105 mmol/L (ref 101–111)
CO2: 24 mmol/L (ref 22–32)
CREATININE: 0.68 mg/dL (ref 0.44–1.00)
Calcium: 8.8 mg/dL — ABNORMAL LOW (ref 8.9–10.3)
GFR calc Af Amer: >60 mL/min (ref >60)
GFR calc non Af Amer: >60 mL/min (ref >60)
Glucose, Bld: 102 mg/dL — ABNORMAL HIGH (ref 65–99)
Potassium: 3.3 mmol/L — ABNORMAL LOW (ref 3.5–5.1)
SODIUM: 137 mmol/L (ref 135–145)
Total Bilirubin: 0.8 mg/dL (ref 0.3–1.2)
Total Protein: 7.1 g/dL (ref 6.5–8.1)

## 2017-10-09 LAB — CBC
HCT: 38 % (ref 36.0–46.0)
Hemoglobin: 12.2 g/dL (ref 12.0–15.0)
MCH: 26 pg (ref 26.0–34.0)
MCHC: 32.1 g/dL (ref 30.0–36.0)
MCV: 81 fL (ref 78.0–100.0)
Platelets: 174 10*3/uL (ref 150–400)
RBC: 4.69 MIL/uL (ref 3.87–5.11)
RDW: 13.9 % (ref 11.5–15.5)
WBC: 4.1 10*3/uL (ref 4.0–10.5)

## 2017-10-09 LAB — LIPASE, BLOOD: LIPASE: 50 U/L (ref 11–51)

## 2017-10-09 LAB — I-STAT BETA HCG BLOOD, ED (MC, WL, AP ONLY)

## 2017-10-09 MED ORDER — CEPHALEXIN 500 MG PO CAPS: 500.0000 mg | ORAL_CAPSULE | Freq: Two times a day (BID) | ORAL | 0 refills | Status: AC

## 2017-10-09 MED ORDER — ONDANSETRON HCL 4 MG/2ML IJ SOLN
4.0000 mg | Freq: Once | INTRAMUSCULAR | Status: AC
  Administered 2017-10-09: 4 mg via INTRAVENOUS
  Filled 2017-10-09: qty 2

## 2017-10-09 MED ORDER — OXYCODONE-ACETAMINOPHEN 5-325 MG PO TABS
1.0000 | ORAL_TABLET | ORAL | Status: DC | PRN
  Administered 2017-10-09: 1 via ORAL
  Filled 2017-10-09: qty 1

## 2017-10-09 MED ORDER — GI COCKTAIL ~~LOC~~
30.0000 mL | Freq: Once | ORAL | Status: AC
  Administered 2017-10-09: 30 mL via ORAL
  Filled 2017-10-09: qty 30

## 2017-10-09 MED ORDER — MORPHINE SULFATE (PF) 4 MG/ML IV SOLN
4.0000 mg | Freq: Once | INTRAVENOUS | Status: AC
  Administered 2017-10-09: 4 mg via INTRAVENOUS
  Filled 2017-10-09: qty 1

## 2017-10-09 MED ORDER — ONDANSETRON 4 MG PO TBDP
4.0000 mg | ORAL_TABLET | Freq: Once | ORAL | Status: AC | PRN
  Administered 2017-10-09: 4 mg via ORAL
  Filled 2017-10-09: qty 1

## 2017-10-09 MED ORDER — SODIUM CHLORIDE 0.9 % IV BOLUS (SEPSIS)
1000.0000 mL | Freq: Once | INTRAVENOUS | Status: AC
  Administered 2017-10-09: 1000 mL via INTRAVENOUS

## 2017-10-09 NOTE — Discharge Instructions (Addendum)
Please read and follow all provided instructions.  Your diagnoses today include:  1. Calculus of gallbladder without cholecystitis without obstruction   2. Abdominal pain   3. Acute cystitis without hematuria     Tests performed today include: Vital signs. See below for your results today.   Medications prescribed:  Take as prescribed   Home care instructions:  Follow any educational materials contained in this packet.  Follow-up instructions: Please follow-up with General Surgery for further evaluation of symptoms and treatment   Return instructions:  Please return to the Emergency Department if you do not get better, if you get worse, or new symptoms OR  - Fever (temperature greater than 101.40F)  - Bleeding that does not stop with holding pressure to the area    -Severe pain (please note that you may be more sore the day after your accident)  - Chest Pain  - Difficulty breathing  - Severe nausea or vomiting  - Inability to tolerate food and liquids  - Passing out  - Skin becoming red around your wounds  - Change in mental status (confusion or lethargy)  - New numbness or weakness    Please return if you have any other emergent concerns.  Additional Information:  Your vital signs today were: BP (!) 140/99    Pulse 65    Temp 98.7 F (37.1 C) (Oral)    Resp 16    LMP 10/02/2017    SpO2 100%  If your blood pressure (BP) was elevated above 135/85 this visit, please have this repeated by your doctor within one month. ---------------

## 2017-10-09 NOTE — ED Provider Notes (Signed)
[facility]   CSN: [id number] Arrival date & time: 10/09/17  [id number]     History   Chief Complaint Chief Complaint  Patient presents with  . Abdominal Pain    HPI Kristen Garza is a 26 y.o. female.  HPI  26 y.o. female, presents to the Emergency Department today due to epigastric abdominal pain with onset this morning. Notes pain 10/10 and isolated to RUQ/Epigastric region. Notes N/V for three episode. No diarrhea. Unable to tolerate PO. No CP/SOB. No fevers. No cough/congestion. No meds PTA. No hx same. BM yesterday. No hx abdominal surgeries. No dysuria. No vaginal bleeding/discharge. No other symptoms noted.    Past Medical History:  Diagnosis Date  . Acute pyelonephritis 01/2017  . Fever   . Medical history non-contributory   . No pertinent past medical history     Patient Active Problem List   Diagnosis Date Noted  . UTI (urinary tract infection) 02/12/2017  . Acute pyelonephritis 02/12/2017  . Fever 02/12/2017  . URI (upper respiratory infection) 02/12/2017  . Nausea and vomiting 02/12/2017    Past Surgical History:  Procedure Laterality Date  . NO PAST SURGERIES      OB History    Gravida Para Term Preterm AB Living   3 2 2     2    SAB TAB Ectopic Multiple Live Births           2       Home Medications    Prior to Admission medications   Medication Sig Start Date End Date Taking? Authorizing Provider  acetaminophen (TYLENOL) 325 MG tablet Take 650 mg by mouth every 6 (six) hours as needed for mild pain.    [provider]  cephALEXin (KEFLEX) 500 MG capsule Take 1 capsule (500 mg total) by mouth every 12 (twelve) hours. 02/14/17   Joseph ArtVann, Jessica U, DO  ibuprofen (ADVIL,MOTRIN) 800 MG tablet Take 1 tablet (800 mg total) by mouth every 6 (six) hours as needed. 02/14/17   Joseph ArtVann, Jessica U, DO    Family History History reviewed. No pertinent family history.  Social History Social History    Tobacco Use  . Smoking status: Former Smoker    Packs/day: 0.25    Years: 2.00    Pack years: 0.50    Types: Cigarettes  . Smokeless tobacco: Never Used  Substance Use Topics  . Alcohol use: No  . Drug use: No     Allergies   Patient has no known allergies.   Review of Systems Review of Systems ROS reviewed and all are negative for acute change except as noted in the HPI.  Physical Exam Updated Vital Signs BP (!) 144/87 (BP Location: Right Arm)   Pulse 73   Temp 98.5 F (36.9 C) (Oral)   Resp 18   LMP 10/02/2017   SpO2 100%   Physical Exam  Constitutional: She is oriented to person, place, and time. She appears well-developed and well-nourished. No distress.  HENT:  Head: Normocephalic and atraumatic.  Right Ear: Tympanic membrane, external ear and ear canal normal.  Left Ear: Tympanic membrane, external ear and ear canal normal.  Nose: Nose normal.  Mouth/Throat: Uvula is midline, oropharynx is clear and moist and mucous membranes are normal. No trismus in the jaw. No oropharyngeal exudate, posterior oropharyngeal erythema or tonsillar abscesses.  Eyes: EOM are normal. Pupils are equal, round, and reactive to light.  Neck: Normal range of motion. Neck supple. No tracheal  deviation present.  Cardiovascular: Normal rate, regular rhythm, S1 normal, S2 normal, normal heart sounds, intact distal pulses and normal pulses.  Pulmonary/Chest: Effort normal and breath sounds normal. No respiratory distress. She has no decreased breath sounds. She has no wheezes. She has no rhonchi. She has no rales.  Abdominal: Normal appearance and bowel sounds are normal. There is tenderness in the right upper quadrant and epigastric area. There is positive Murphy's sign. There is no rigidity, no rebound, no guarding, no CVA tenderness and no tenderness at McBurney's point.  Musculoskeletal: Normal range of motion.  Neurological: She is alert and oriented to person, place, and time.  Skin:  Skin is warm and dry.  Psychiatric: She has a normal mood and affect. Her speech is normal and behavior is normal. Thought content normal.  Nursing note and vitals reviewed.    ED Treatments / Results  Labs (all labs ordered are listed, but only abnormal results are displayed) Labs Reviewed  COMPREHENSIVE METABOLIC PANEL - Abnormal; Notable for the following components:      Result Value   Potassium 3.3 (*)    Glucose, Bld 102 (*)    Calcium 8.8 (*)    All other components within normal limits  URINALYSIS, ROUTINE W REFLEX MICROSCOPIC - Abnormal; Notable for the following components:   APPearance HAZY (*)    Hgb urine dipstick SMALL (*)    Protein, ur 30 (*)    Nitrite POSITIVE (*)    Leukocytes, UA TRACE (*)    Bacteria, UA MANY (*)    Squamous Epithelial / LPF 0-5 (*)    All other components within normal limits  LIPASE, BLOOD  CBC  I-STAT BETA HCG BLOOD, ED ([facility], [facility], [facility] ONLY)    EKG  EKG Interpretation None       Radiology US Abdomen Limited Ruq  Result Date: 10/09/2017 CLINICAL DATA:  Right upper quadrant pain EXAM: ULTRASOUND ABDOMEN LIMITED RIGHT UPPER QUADRANT COMPARISON:  Abdominal and pelvic CT scan of February 12, 2017 FINDINGS: Gallbladder: The gallbladder is adequately distended. There is an echogenic stone measuring 1.7 cm near the gallbladder neck. A small amount of sludge is present. There is no gallbladder wall thickening, pericholecystic fluid, or positive sonographic Murphy's sign. Common bile duct: Diameter: 5.3 mm Liver: No focal lesion identified. Within normal limits in parenchymal echogenicity. Portal vein is patent on color Doppler imaging with normal direction of blood flow towards the liver. IMPRESSION: Gallstones and sludge without evidence of acute cholecystitis. Normal appearance of the liver and common bile duct. Electronically Signed   By: David  [place] M.D.   On: 10/09/2017 12:27    Procedures Procedures (including critical care  time)  Medications Ordered in ED Medications  oxyCODONE-acetaminophen (PERCOCET/ROXICET) 5-325 MG per tablet 1 tablet (1 tablet Oral Given 10/09/17 0952)  ondansetron (ZOFRAN-ODT) disintegrating tablet 4 mg (4 mg Oral Given 10/09/17 0952)  sodium chloride 0.9 % bolus 1,000 mL (1,000 mLs Intravenous New Bag/Given 10/09/17 1141)  ondansetron (ZOFRAN) injection 4 mg (4 mg Intravenous Given 10/09/17 1141)  morphine 4 MG/ML injection 4 mg (4 mg Intravenous Given 10/09/17 1141)  gi cocktail (Maalox,Lidocaine,Donnatal) (30 mLs Oral Given 10/09/17 1313)     Initial Impression / Assessment and Plan / ED Course  I have reviewed the triage vital signs and the nursing notes.  Pertinent labs & imaging results that were available during my care of the patient were reviewed by me and considered in my medical decision making (see chart for details).  Final Clinical Impressions(s) / ED Diagnoses  {I have reviewed and evaluated the relevant laboratory values. {I have reviewed and evaluated the relevant imaging studies.  {I have reviewed the relevant previous healthcare records.  {I obtained HPI from historian.   ED Course:  Assessment: Pt is a 26 y.o. female presents to the Emergency Department today due to epigastric abdominal pain with onset this morning. Notes pain 10/10 and isolated to RUQ/Epigastric region. Notes N/V for three episode. No diarrhea. Unable to tolerate PO. No CP/SOB. No fevers. No cough/congestion. No meds PTA. No hx same. BM yesterday. No hx abdominal surgeries. No dysuria. No vaginal bleeding/discharge. On exam, pt in NAD. Nontoxic/nonseptic appearing. VSS. Afebrile. Lungs CTA. Heart RRR. Abdomen TTP RUQ. Labs reassuring. No WBC. Lipase WNL. RUQ US with cholelithiasis without cholecystitis. UA with evidence of UTI. Culture sent. No dysuria. No back pain. Given analgesia in ED. Fluid challenge with success. Symptoms likely related to gall stones. Plan is to DC home with follow up to General  Surgery . At time of discharge, Patient is in no acute distress. Vital Signs are stable. Patient is able to ambulate. Patient able to tolerate PO.   Disposition/Plan:  DC Home Additional Verbal discharge instructions given and discussed with patient.  Pt Instructed to f/u with General Surgery in the next week for evaluation and treatment of symptoms. Return precautions given Pt acknowledges and agrees with plan  Supervising Physician Melene PlanFloyd, Dan, DO  Final diagnoses:  Abdominal pain  Calculus of gallbladder without cholecystitis without obstruction  Acute cystitis without hematuria    ED Discharge Orders    None       Audry PiliMohr, Andrus Sharp, PA-C 10/09/17 1425    Melene PlanFloyd, Dan, DO 10/09/17 1453

## 2017-10-09 NOTE — ED Triage Notes (Signed)
Pt to ER for evaluation of epigastric abd pain onset this morning with nausea and vomiting x3. VSS. Pt unable to sit still in triage. Pt reports LMP last week.

## 2017-10-12 LAB — URINE CULTURE: Culture: >=100000 — AB

## 2017-10-13 ENCOUNTER — Telehealth: Payer: Self-pay

## 2017-10-13 NOTE — Telephone Encounter (Signed)
Post ED Visit - Positive Culture Follow-up  Culture report reviewed by antimicrobial stewardship pharmacist:  []  Enzo BiNathan Batchelder, Pharm.D. []  Celedonio MiyamotoJeremy Frens, Pharm.D., BCPS AQ-ID []  Garvin FilaMike Maccia, Pharm.D., BCPS []  Georgina PillionElizabeth Martin, [address]Ph[address]D., BCPS []  BuchananMinh Pham, [place]arm.D., BCPS, AAHIVP []  Estella HuskMichelle Turner, Pharm.D., BCPS, AAHIVP []  Lysle Pearlachel Rumbarger, PharmD, BCPS []  Casilda Carlsaylor Stone, PharmD, BCPS []  Pollyann SamplesAndy Johnston, PharmD, BCPS Pharm D Positive urine culture Treated with Ce[halexin, organism sensitive to the same and no further patient follow-up is required at this time.  Jerry CarasCullom, Medora Roorda Burnett 10/13/2017, 9:11 AM

## 2018-02-15 IMAGING — DX DG CHEST 2V
2 series · 2 of 2 positions shown · non-contrast
Comparison: None.

CLINICAL DATA: Fever and cough for 1 day

EXAM:
CHEST  2 VIEW

[w chest pa]
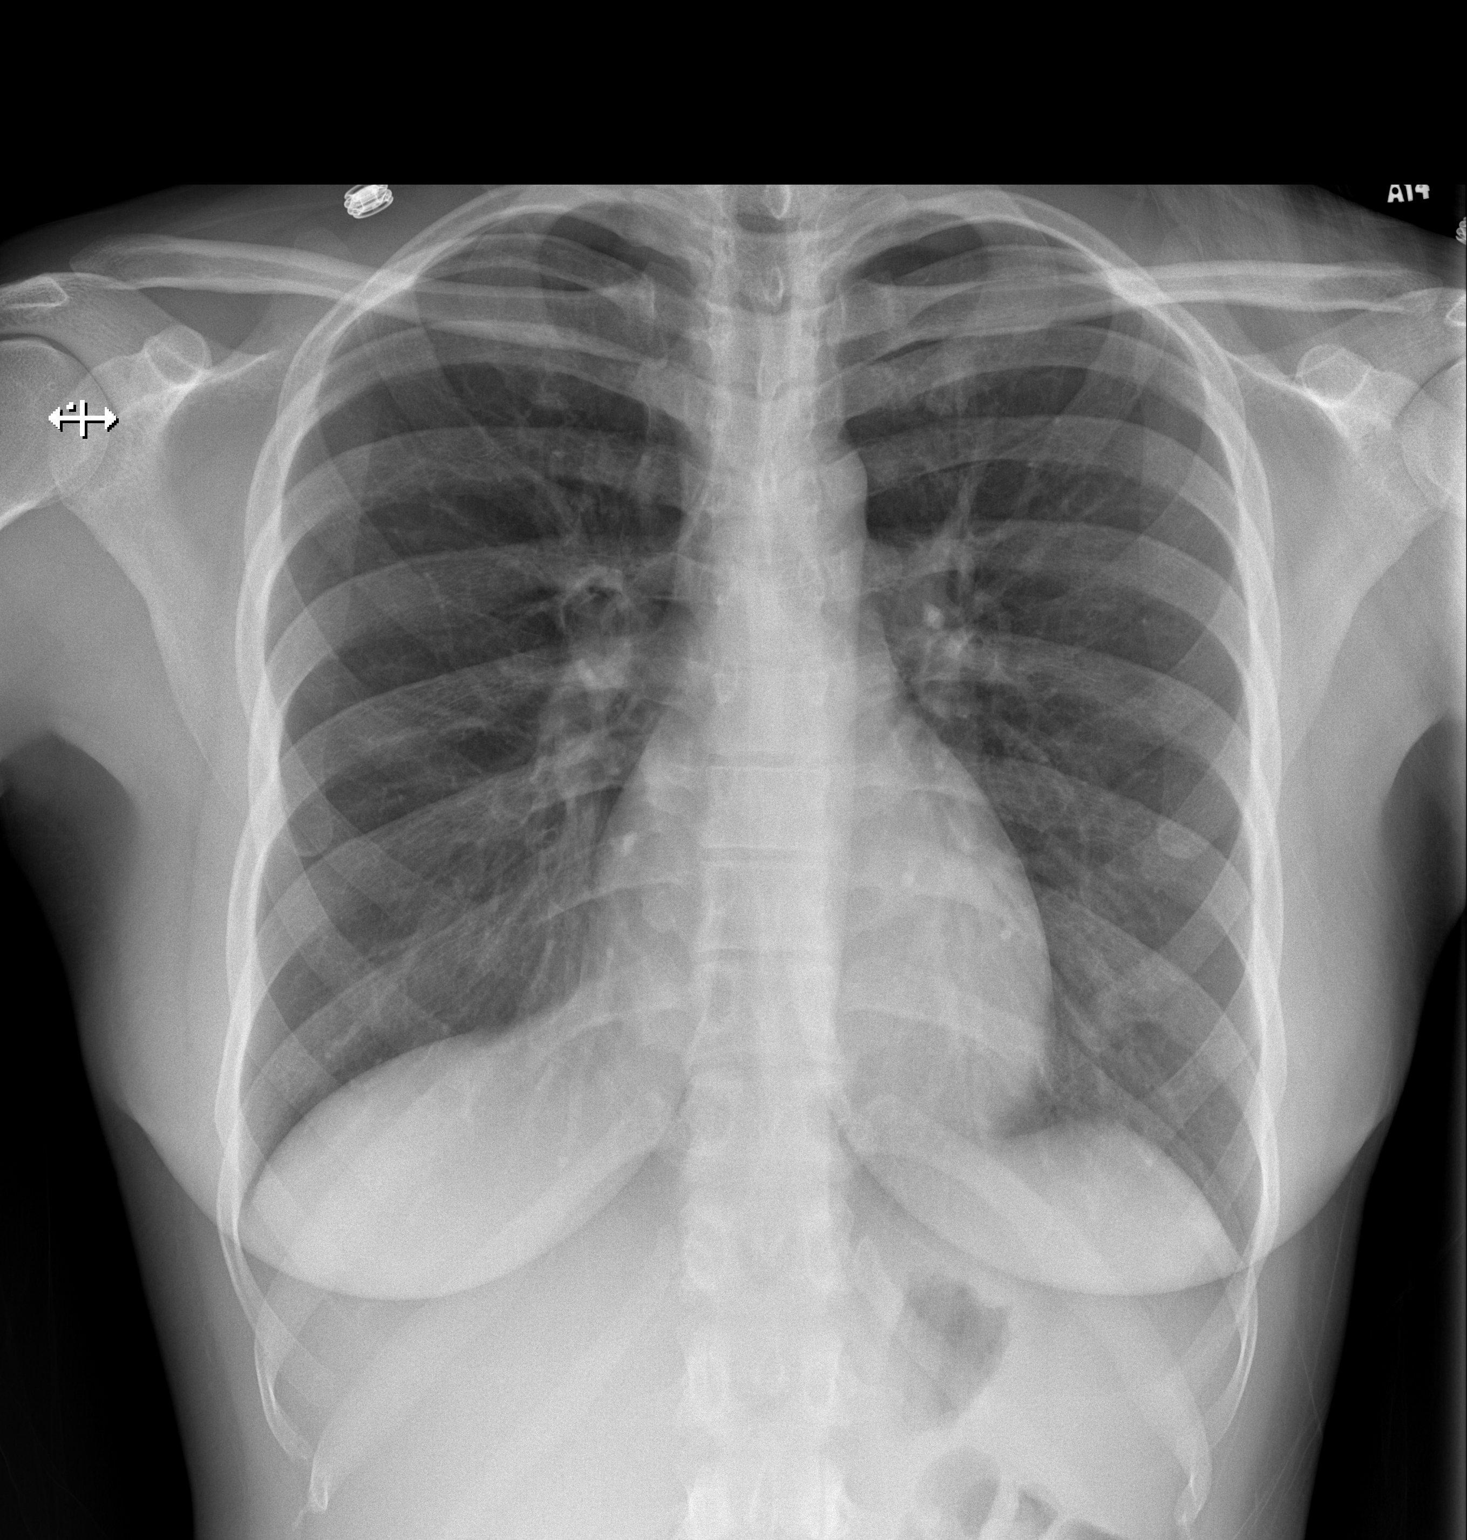

[w chest lat]
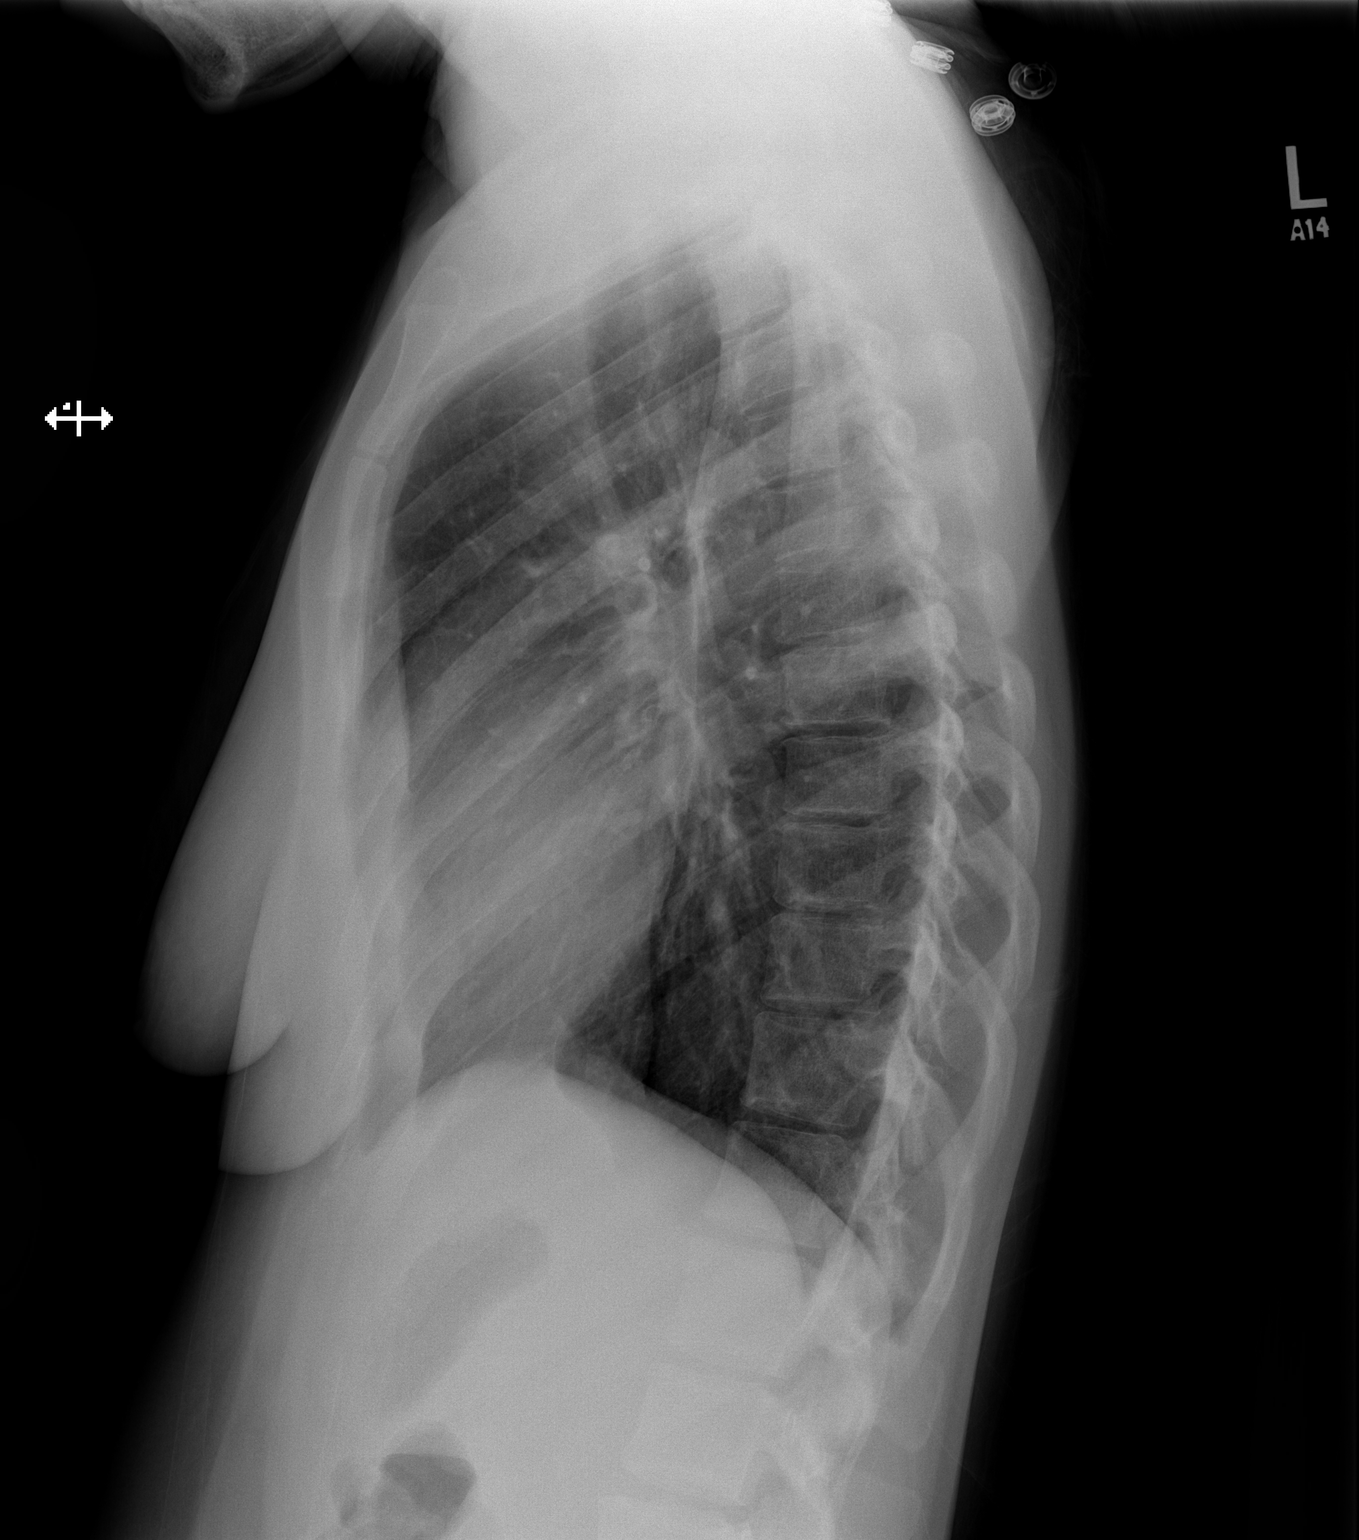

[2 of 2 positions shown; findings below may reference images not displayed]

FINDINGS: Lungs are clear. Heart size and pulmonary vascularity are normal. No
adenopathy. No pneumothorax. No bone lesions.
IMPRESSION: No edema or consolidation.

## 2018-03-05 ENCOUNTER — Emergency Department (HOSPITAL_COMMUNITY)
Admission: EM | Admit: 2018-03-05 | Discharge: 2018-03-06 | Disposition: A | Discharge status: Home / Self Care | Payer: Self-pay | Attending: Emergency Medicine | Admitting: Emergency Medicine

## 2018-03-05 ENCOUNTER — Other Ambulatory Visit: Payer: Self-pay

## 2018-03-05 ENCOUNTER — Encounter (HOSPITAL_COMMUNITY): Payer: Self-pay | Admitting: Emergency Medicine

## 2018-03-05 DIAGNOSIS — R1010 Upper abdominal pain, unspecified: Secondary | ICD-10-CM

## 2018-03-05 DIAGNOSIS — K8050 Calculus of bile duct without cholangitis or cholecystitis without obstruction: Secondary | ICD-10-CM | POA: Insufficient documentation

## 2018-03-05 DIAGNOSIS — Z87891 Personal history of nicotine dependence: Secondary | ICD-10-CM | POA: Insufficient documentation

## 2018-03-05 LAB — URINALYSIS, ROUTINE W REFLEX MICROSCOPIC
Bilirubin Urine: NEGATIVE
Glucose, UA: NEGATIVE mg/dL
HGB URINE DIPSTICK: NEGATIVE
Ketones, ur: 5 mg/dL — AB
Nitrite: NEGATIVE
PROTEIN: NEGATIVE mg/dL
SPECIFIC GRAVITY, URINE: 1.023 (ref 1.005–1.030)
pH: 7 (ref 5.0–8.0)

## 2018-03-05 LAB — COMPREHENSIVE METABOLIC PANEL
ALT: 10 U/L — ABNORMAL LOW (ref 14–54)
ANION GAP: 9 (ref 5–15)
AST: 16 U/L (ref 15–41)
Albumin: 4.3 g/dL (ref 3.5–5.0)
Alkaline Phosphatase: 42 U/L (ref 38–126)
BILIRUBIN TOTAL: 0.7 mg/dL (ref 0.3–1.2)
BUN: 8 mg/dL (ref 6–20)
CO2: 20 mmol/L — ABNORMAL LOW (ref 22–32)
Calcium: 9.3 mg/dL (ref 8.9–10.3)
Chloride: 107 mmol/L (ref 101–111)
Creatinine, Ser: 0.66 mg/dL (ref 0.44–1.00)
Glucose, Bld: 103 mg/dL — ABNORMAL HIGH (ref 65–99)
POTASSIUM: 3.5 mmol/L (ref 3.5–5.1)
Sodium: 136 mmol/L (ref 135–145)
TOTAL PROTEIN: 7.7 g/dL (ref 6.5–8.1)

## 2018-03-05 LAB — CBC
HEMATOCRIT: 35.8 % — AB (ref 36.0–46.0)
Hemoglobin: 11.1 g/dL — ABNORMAL LOW (ref 12.0–15.0)
MCH: 23.8 pg — ABNORMAL LOW (ref 26.0–34.0)
MCHC: 31 g/dL (ref 30.0–36.0)
MCV: 76.7 fL — ABNORMAL LOW (ref 78.0–100.0)
Platelets: 184 10*3/uL (ref 150–400)
RBC: 4.67 MIL/uL (ref 3.87–5.11)
RDW: 13.7 % (ref 11.5–15.5)
WBC: 6.8 10*3/uL (ref 4.0–10.5)

## 2018-03-05 LAB — LIPASE, BLOOD: Lipase: 46 U/L (ref 11–51)

## 2018-03-05 LAB — I-STAT BETA HCG BLOOD, ED (MC, WL, AP ONLY): I-stat hCG, quantitative: <5 m[IU]/mL (ref <5)

## 2018-03-05 MED ORDER — ONDANSETRON 4 MG PO TBDP
4.0000 mg | ORAL_TABLET | Freq: Once | ORAL | Status: AC
  Administered 2018-03-05: 4 mg via ORAL
  Filled 2018-03-05: qty 1

## 2018-03-05 NOTE — ED Triage Notes (Signed)
Pt c/o generalized abdominal pain with nausea/vomiting that started this am. Denies diarrhea/urinary symptoms. LMP March 2019.

## 2018-03-06 ENCOUNTER — Other Ambulatory Visit: Payer: Self-pay

## 2018-03-06 MED ORDER — ONDANSETRON HCL 4 MG PO TABS: 4.0000 mg | ORAL_TABLET | Freq: Three times a day (TID) | ORAL | 0 refills | Status: DC | PRN

## 2018-03-06 MED ORDER — HYDROCODONE-ACETAMINOPHEN 5-325 MG PO TABS: 1.0000 | ORAL_TABLET | Freq: Four times a day (QID) | ORAL | 0 refills | Status: DC | PRN

## 2018-03-06 MED ORDER — OXYCODONE-ACETAMINOPHEN 5-325 MG PO TABS
1.0000 | ORAL_TABLET | Freq: Once | ORAL | Status: AC
  Administered 2018-03-06: 1 via ORAL
  Filled 2018-03-06: qty 1

## 2018-03-06 MED ORDER — FENTANYL CITRATE (PF) 100 MCG/2ML IJ SOLN: 50.0000 ug | Freq: Once | INTRAMUSCULAR | Status: DC

## 2018-03-06 NOTE — ED Notes (Addendum)
Sprite given to pt for PO challenge. Pt denies any current nausea/abd pain. Will continue to monitor. Thurnell GarbeKayla H Claribel Sachs

## 2018-03-06 NOTE — ED Provider Notes (Signed)
[facility]   CSN: [id number] Arrival date & time: 03/05/18  1931     History   Chief Complaint Chief Complaint  Patient presents with  . Abdominal Pain    HPI Kristen Garza is a 27 y.o. female.  HPI  This is a 27 year old female who presents with abdominal pain and vomiting.  Patient reports onset of symptoms yesterday morning.  They were ongoing and continuous.  Pain was upper abdominal and right upper quadrant.  She has had similar pain like this in the past.  It was not necessarily related to eating.  She denies any fevers or diarrhea.  No known sick contacts.  Currently she is pain-free.  She received a Percocet in the waiting room which seem to help.  Chart review reveals that she was seen and evaluated in November and diagnosed with gallstones.  She has not followed up with general surgery.  Past Medical History:  Diagnosis Date  . Acute pyelonephritis 01/2017  . Fever   . Medical history non-contributory   . No pertinent past medical history     Patient Active Problem List   Diagnosis Date Noted  . UTI (urinary tract infection) 02/12/2017  . Acute pyelonephritis 02/12/2017  . Fever 02/12/2017  . URI (upper respiratory infection) 02/12/2017  . Nausea and vomiting 02/12/2017    Past Surgical History:  Procedure Laterality Date  . NO PAST SURGERIES       OB History    Gravida  3   Para  2   Term  2   Preterm      AB      Living  2     SAB      TAB      Ectopic      Multiple      Live Births  2            Home Medications    Prior to Admission medications   Medication Sig Start Date End Date Taking? Authorizing Provider  HYDROcodone-acetaminophen (NORCO/VICODIN) 5-325 MG tablet Take 1 tablet by mouth every 6 (six) hours as needed. 03/06/18   Kimberley Dastrup, Mayer Maskerourtney F, MD  ibuprofen (ADVIL,MOTRIN) 800 MG tablet Take 1 tablet (800 mg total) by mouth every 6 (six) hours as needed. Patient  not taking: Reported on 03/06/2018 02/14/17   Joseph ArtVann, Jessica U, DO  ondansetron (ZOFRAN) 4 MG tablet Take 1 tablet (4 mg total) by mouth every 8 (eight) hours as needed for nausea or vomiting. 03/06/18   Laden Fieldhouse, Mayer Maskerourtney F, MD    Family History No family history on file.  Social History Social History   Tobacco Use  . Smoking status: Former Smoker    Packs/day: 0.25    Years: 2.00    Pack years: 0.50    Types: Cigarettes  . Smokeless tobacco: Never Used  Substance Use Topics  . Alcohol use: No  . Drug use: No     Allergies   Patient has no known allergies.   Review of Systems Review of Systems  Constitutional: Negative for fever.  Respiratory: Negative for shortness of breath.   Cardiovascular: Negative for chest pain.  Gastrointestinal: Positive for abdominal pain, nausea and vomiting. Negative for diarrhea.  Genitourinary: Negative for dysuria.  Musculoskeletal: Negative for back pain.  All other systems reviewed and are negative.    Physical Exam Updated Vital Signs BP 125/77   Pulse 68   Temp 98.6 F (37 C) (Oral)  Resp 14   Ht 5\' 4"  (1.626 m)   Wt 64 kg (141 lb)   LMP 02/02/2018   SpO2 100%   BMI 24.20 kg/m   Physical Exam  Constitutional: She is oriented to person, place, and time. She appears well-developed and well-nourished. She does not appear ill.  HENT:  Head: Normocephalic and atraumatic.  Neck: Neck supple.  Cardiovascular: Normal rate, regular rhythm and normal heart sounds.  Pulmonary/Chest: Effort normal. No respiratory distress. She has no wheezes.  Abdominal: Soft. Bowel sounds are normal. There is tenderness in the right upper quadrant. There is negative Murphy's sign.  Right upper quadrant tenderness to palpation, no rebound or guarding  Neurological: She is alert and oriented to person, place, and time.  Skin: Skin is warm and dry.  Psychiatric: She has a normal mood and affect.  Nursing note and vitals reviewed.    ED Treatments /  Results  Labs (all labs ordered are listed, but only abnormal results are displayed) Labs Reviewed  COMPREHENSIVE METABOLIC PANEL - Abnormal; Notable for the following components:      Result Value   CO2 20 (*)    Glucose, Bld 103 (*)    ALT 10 (*)    All other components within normal limits  CBC - Abnormal; Notable for the following components:   Hemoglobin 11.1 (*)    HCT 35.8 (*)    MCV 76.7 (*)    MCH 23.8 (*)    All other components within normal limits  URINALYSIS, ROUTINE W REFLEX MICROSCOPIC - Abnormal; Notable for the following components:   APPearance CLOUDY (*)    Ketones, ur 5 (*)    Leukocytes, UA TRACE (*)    Bacteria, UA FEW (*)    Squamous Epithelial / LPF 6-30 (*)    All other components within normal limits  LIPASE, BLOOD  I-STAT BETA HCG BLOOD, ED ([facility], [facility], [facility] ONLY)    EKG None  Radiology No results found.  Procedures Procedures (including critical care time)  Medications Ordered in ED Medications  oxyCODONE-acetaminophen (PERCOCET/ROXICET) 5-325 MG per tablet 1 tablet (has no administration in time range)  ondansetron (ZOFRAN-ODT) disintegrating tablet 4 mg (4 mg Oral Given 03/05/18 2151)     Initial Impression / Assessment and Plan / ED Course  I have reviewed the triage vital signs and the nursing notes.  Pertinent labs & imaging results that were available during my care of the patient were reviewed by me and considered in my medical decision making (see chart for details).     She presents with abdominal pain and vomiting.  She is overall nontoxic appearing and vital signs are reassuring.  She is afebrile.  She denies any pain on initial history taking.  On exam she has some mild right upper quadrant tenderness to palpation.  History of gallstones but has not followed up with general surgery.  Her lab work was reviewed.  No leukocytosis.  LFTs are reassuring.  I discussed options with the patient.  Given that she is now pain-free, could attempt  p.o. challenge and pain control with close general surgery follow-up.  Doubt cholecystitis given reassuring exam and lab work.  Patient does report some mild increase of pain after eating.  I discussed with her obtaining right upper quadrant ultrasound.  She is not interested in this.  I discussed with her that she needs to have very close surgical follow-up as she likely needs to have her gallbladder removed.  Her exam remains reassuring.  She was instructed to avoid foods that provoke her symptoms.  She will be discharged with a short course of pain medication.  After history, exam, and medical workup I feel the patient has been appropriately medically screened and is safe for discharge home. Pertinent diagnoses were discussed with the patient. Patient was given return precautions.  Final Clinical Impressions(s) / ED Diagnoses   Final diagnoses:  Upper abdominal pain  Biliary colic    ED Discharge Orders        Ordered    HYDROcodone-acetaminophen (NORCO/VICODIN) 5-325 MG tablet  Every 6 hours PRN     03/06/18 0432    ondansetron (ZOFRAN) 4 MG tablet  Every 8 hours PRN     03/06/18 0432       Shon Baton, MD 03/06/18 [id number]

## 2018-03-06 NOTE — Discharge Instructions (Addendum)
You were seen today for abdominal pain.  This is likely related to your known gallstones.  It is very important you follow-up with general surgery.  If your pain worsens, you were unable to tolerate fluids, or you have any other new or worsening symptoms, you should be reevaluated.

## 2018-03-13 ENCOUNTER — Other Ambulatory Visit: Payer: Self-pay

## 2018-03-13 ENCOUNTER — Emergency Department (HOSPITAL_COMMUNITY)
Admission: EM | Admit: 2018-03-13 | Discharge: 2018-03-13 | Disposition: A | Discharge status: Home / Self Care | Payer: Self-pay | Attending: Emergency Medicine | Admitting: Emergency Medicine

## 2018-03-13 ENCOUNTER — Encounter (HOSPITAL_COMMUNITY): Payer: Self-pay

## 2018-03-13 DIAGNOSIS — Z5321 Procedure and treatment not carried out due to patient leaving prior to being seen by health care provider: Secondary | ICD-10-CM | POA: Insufficient documentation

## 2018-03-13 DIAGNOSIS — R109 Unspecified abdominal pain: Secondary | ICD-10-CM

## 2018-03-13 NOTE — ED Notes (Signed)
Patient called for room placement x1 with no answer. 

## 2018-03-13 NOTE — ED Notes (Signed)
Called Pt to be roomed, no response in lobby x2.

## 2018-03-13 NOTE — ED Provider Notes (Signed)
Pt was not seen or evaluated by myself or the PA in the ER   Azalia Bilisampos, Juwaun Inskeep, MD 03/13/18 2215

## 2018-03-13 NOTE — ED Triage Notes (Addendum)
Patient c/o LLQ pain since she woke at 0900 today. Patient denies any N/v/D. Patient reports a history of gallstones. Patient states she was taking Hydrocodone 5/375, bu t"it doesn't work good" and then stated that she is out of this medication. Patient stated, [facility] and that works much better."

## 2018-03-16 ENCOUNTER — Emergency Department (HOSPITAL_COMMUNITY)
Admission: EM | Admit: 2018-03-16 | Discharge: 2018-03-16 | Discharge status: Against Medical Advice | Payer: Self-pay | Attending: Emergency Medicine | Admitting: Emergency Medicine

## 2018-03-16 ENCOUNTER — Encounter (HOSPITAL_COMMUNITY): Payer: Self-pay | Admitting: Emergency Medicine

## 2018-03-16 ENCOUNTER — Emergency Department (HOSPITAL_COMMUNITY): Payer: Self-pay

## 2018-03-16 DIAGNOSIS — Z5320 Procedure and treatment not carried out because of patient's decision for unspecified reasons: Secondary | ICD-10-CM | POA: Insufficient documentation

## 2018-03-16 DIAGNOSIS — R1011 Right upper quadrant pain: Secondary | ICD-10-CM | POA: Insufficient documentation

## 2018-03-16 DIAGNOSIS — K8020 Calculus of gallbladder without cholecystitis without obstruction: Secondary | ICD-10-CM | POA: Insufficient documentation

## 2018-03-16 DIAGNOSIS — N3001 Acute cystitis with hematuria: Secondary | ICD-10-CM | POA: Insufficient documentation

## 2018-03-16 LAB — URINALYSIS, ROUTINE W REFLEX MICROSCOPIC
Bilirubin Urine: NEGATIVE
Glucose, UA: NEGATIVE mg/dL
KETONES UR: NEGATIVE mg/dL
Nitrite: POSITIVE — AB
PROTEIN: NEGATIVE mg/dL
Specific Gravity, Urine: 1.012 (ref 1.005–1.030)
pH: 6 (ref 5.0–8.0)

## 2018-03-16 LAB — COMPREHENSIVE METABOLIC PANEL
ALT: 17 U/L (ref 14–54)
ANION GAP: 8 (ref 5–15)
AST: 19 U/L (ref 15–41)
Albumin: 4.4 g/dL (ref 3.5–5.0)
Alkaline Phosphatase: 44 U/L (ref 38–126)
BUN: 11 mg/dL (ref 6–20)
CHLORIDE: 106 mmol/L (ref 101–111)
CO2: 25 mmol/L (ref 22–32)
Calcium: 9.1 mg/dL (ref 8.9–10.3)
Creatinine, Ser: 0.63 mg/dL (ref 0.44–1.00)
GFR calc Af Amer: >60 mL/min (ref >60)
GFR calc non Af Amer: >60 mL/min (ref >60)
Glucose, Bld: 89 mg/dL (ref 65–99)
Potassium: 3.8 mmol/L (ref 3.5–5.1)
SODIUM: 139 mmol/L (ref 135–145)
Total Bilirubin: 1.3 mg/dL — ABNORMAL HIGH (ref 0.3–1.2)
Total Protein: 8 g/dL (ref 6.5–8.1)

## 2018-03-16 LAB — CBC
HEMATOCRIT: 37.1 % (ref 36.0–46.0)
HEMOGLOBIN: 11.5 g/dL — AB (ref 12.0–15.0)
MCH: 24.5 pg — AB (ref 26.0–34.0)
MCHC: 31 g/dL (ref 30.0–36.0)
MCV: 79.1 fL (ref 78.0–100.0)
Platelets: 225 10*3/uL (ref 150–400)
RBC: 4.69 MIL/uL (ref 3.87–5.11)
RDW: 13.8 % (ref 11.5–15.5)
WBC: 2.5 10*3/uL — ABNORMAL LOW (ref 4.0–10.5)

## 2018-03-16 LAB — LIPASE, BLOOD: LIPASE: 51 U/L (ref 11–51)

## 2018-03-16 LAB — I-STAT BETA HCG BLOOD, ED (MC, WL, AP ONLY): I-stat hCG, quantitative: <5 m[IU]/mL (ref <5)

## 2018-03-16 MED ORDER — ACETAMINOPHEN 325 MG PO TABS
650.0000 mg | ORAL_TABLET | Freq: Once | ORAL | Status: DC
  Filled 2018-03-16: qty 2

## 2018-03-16 MED ORDER — ACETAMINOPHEN 325 MG PO TABS: 650.0000 mg | ORAL_TABLET | Freq: Four times a day (QID) | ORAL | 0 refills | Status: DC | PRN

## 2018-03-16 MED ORDER — CEPHALEXIN 500 MG PO CAPS: 500.0000 mg | ORAL_CAPSULE | Freq: Two times a day (BID) | ORAL | 0 refills | Status: AC

## 2018-03-16 NOTE — ED Notes (Signed)
RN and PA went into room to speak with patient. Pt was no longer in room. Pt belongings no longer in room. Believe pt eloped.

## 2018-03-16 NOTE — ED Notes (Signed)
Patient refused tylenol. States "I told the doctor hydrocodone wasn't working and you walk in with that. Its not going to help. I dont want it."

## 2018-03-16 NOTE — ED Notes (Signed)
Patient states sh is unable to give a urine sample at this time

## 2018-03-16 NOTE — ED Triage Notes (Signed)
Pt c/o right side abd pain that started around 8am today. Denies n/v/d or urinary problems.

## 2018-03-16 NOTE — Discharge Instructions (Addendum)
You were given a prescription for antibiotics to treat your urinary tract infection. Please take the antibiotic prescription fully.   You had a low white blood cell count today and you will need to follow-up with your primary care doctor about this.  On your abdominal ultrasound you were diagnosed with stones in your gallbladder.  You were given a referral to general surgery so that you can follow-up about this.  You will need to call the office to schedule an appointment.  Please follow up with your primary doctor within the next 5-7 days.  If you do not have a primary care provider, information for a healthcare clinic has been provided for you to make arrangements for follow up care. Please return to the ER sooner if you have any new or worsening symptoms, or if you have any of the following symptoms:  Abdominal pain that does not go away.  You have a fever.  You keep throwing up (vomiting).  The pain is felt only in portions of the abdomen. Pain in the right side could possibly be appendicitis. In an adult, pain in the left lower portion of the abdomen could be colitis or diverticulitis.  You pass bloody or black tarry stools.  There is bright red blood in the stool.  The constipation stays for more than 4 days.  There is belly (abdominal) or rectal pain.  You do not seem to be getting better.  You have any questions or concerns.

## 2018-03-16 NOTE — ED Provider Notes (Signed)
[facility] DEPT Provider Note   CSN: [id number] Arrival date & time: 03/16/18  1019     History   Chief Complaint Chief Complaint  Patient presents with  . Abdominal Pain    HPI Kristen Garza is a 27 y.o. female.  HPI   Patient is a 27 year old female with a history of pyelonephritis, cholelithiasis who presents the ED today complaining of right upper quadrant abdominal pain that has been intermittent for the last several days, but became constant when she woke up this morning.  Rates pain 8/10.  Pain is been worse intermittently when she eats fried or fatty foods however today she denies any specific exacerbating factors.  She is tried taking ibuprofen and Zofran with no relief.  She tells me that at [facility] she had really received Percocet which did help relieve her symptoms in the past.  She ran out of her prescription of Percocet 2 days ago.  She denies any fevers, flank pain, nausea, vomiting, diarrhea, constipation, blood in stool.  Denies any urinary symptoms including no dysuria hematuria, frequency.  No pelvic pain or vaginal symptoms including no abnormal vaginal discharge or vaginal bleeding.  Denies chest pain or shortness of breath.  Past Medical History:  Diagnosis Date  . Acute pyelonephritis 01/2017  . Fever   . Medical history non-contributory   . No pertinent past medical history     Patient Active Problem List   Diagnosis Date Noted  . UTI (urinary tract infection) 02/12/2017  . Acute pyelonephritis 02/12/2017  . Fever 02/12/2017  . URI (upper respiratory infection) 02/12/2017  . Nausea and vomiting 02/12/2017    Past Surgical History:  Procedure Laterality Date  . NO PAST SURGERIES       OB History    Gravida  3   Para  2   Term  2   Preterm      AB      Living  2     SAB      TAB      Ectopic      Multiple      Live Births  2            Home Medications    Prior to Admission  medications   Medication Sig Start Date End Date Taking? Authorizing Provider  HYDROcodone-acetaminophen (NORCO/VICODIN) 5-325 MG tablet Take 1 tablet by mouth every 6 (six) hours as needed. 03/06/18  Yes Horton, Barbette Hair, MD  ondansetron (ZOFRAN) 4 MG tablet Take 1 tablet (4 mg total) by mouth every 8 (eight) hours as needed for nausea or vomiting. 03/06/18  Yes Horton, Barbette Hair, MD  acetaminophen (TYLENOL) 325 MG tablet Take 2 tablets (650 mg total) by mouth every 6 (six) hours as needed. Do not take more than '4000mg'$  of tylenol per day 03/16/18   Katelind Pytel S, PA-C  cephALEXin (KEFLEX) 500 MG capsule Take 1 capsule (500 mg total) by mouth 2 (two) times daily for 7 days. 03/16/18 03/23/18  Karmella Bouvier S, PA-C  ibuprofen (ADVIL,MOTRIN) 800 MG tablet Take 1 tablet (800 mg total) by mouth every 6 (six) hours as needed. Patient not taking: Reported on 03/06/2018 02/14/17   Geradine Girt, DO    Family History No family history on file.  Social History Social History   Tobacco Use  . Smoking status: Current Every Day Smoker    Packs/day: 0.25    Years: 2.00    Pack years: 0.50  Types: Cigarettes  . Smokeless tobacco: Never Used  Substance Use Topics  . Alcohol use: No  . Drug use: No     Allergies   Patient has no known allergies.   Review of Systems Review of Systems  Constitutional: Negative for fever.  HENT: Negative for sore throat.   Eyes: Negative for visual disturbance.  Respiratory: Negative for shortness of breath.   Cardiovascular: Negative for chest pain.  Gastrointestinal: Positive for abdominal pain. Negative for blood in stool, constipation, diarrhea, nausea and vomiting.  Genitourinary: Negative for dysuria, flank pain, frequency, hematuria, pelvic pain, urgency, vaginal discharge and vaginal pain.  Musculoskeletal: Negative for back pain and myalgias.  Skin: Negative for wound.  Neurological: Negative for light-headedness and headaches.     Physical  Exam Updated Vital Signs BP 123/80   Pulse 66   Temp 97.9 F (36.6 C) (Oral)   Resp 15   Ht '5\' 4"'$  (1.626 m)   Wt 63.5 kg (140 lb)   LMP 03/13/2018   SpO2 100%   BMI 24.03 kg/m   Physical Exam  Constitutional: She appears well-developed and well-nourished. No distress.  Nontoxic-appearing  HENT:  Head: Normocephalic and atraumatic.  Mouth/Throat: Oropharynx is clear and moist.  Eyes: Pupils are equal, round, and reactive to light. Conjunctivae and EOM are normal. No scleral icterus.  Neck: Neck supple.  Cardiovascular: Normal rate, regular rhythm and normal heart sounds.  No murmur heard. Pulmonary/Chest: Effort normal and breath sounds normal. No respiratory distress.  Abdominal: Soft. Bowel sounds are normal. She exhibits no distension.  Right upper quadrant tenderness, no grimace or guarding noted on exam.  Negative Murphy's sign.  No rigidity or rebound tenderness.  No CVA tenderness.  Musculoskeletal: She exhibits no edema.  Neurological: She is alert.  Skin: Skin is warm and dry.  Psychiatric: She has a normal mood and affect.  Nursing note and vitals reviewed.    ED Treatments / Results  Labs (all labs ordered are listed, but only abnormal results are displayed) Labs Reviewed  COMPREHENSIVE METABOLIC PANEL - Abnormal; Notable for the following components:      Result Value   Total Bilirubin 1.3 (*)    All other components within normal limits  CBC - Abnormal; Notable for the following components:   WBC 2.5 (*)    Hemoglobin 11.5 (*)    MCH 24.5 (*)    All other components within normal limits  URINALYSIS, ROUTINE W REFLEX MICROSCOPIC - Abnormal; Notable for the following components:   APPearance HAZY (*)    Hgb urine dipstick SMALL (*)    Nitrite POSITIVE (*)    Leukocytes, UA TRACE (*)    Bacteria, UA MANY (*)    Squamous Epithelial / LPF 0-5 (*)    All other components within normal limits  LIPASE, BLOOD  I-STAT BETA HCG BLOOD, ED ([facility], [facility], [facility] ONLY)     EKG None  Radiology US Abdomen Limited Ruq  Result Date: 03/16/2018 CLINICAL DATA:  Right upper quadrant abdominal pain. EXAM: ULTRASOUND ABDOMEN LIMITED RIGHT UPPER QUADRANT COMPARISON:  Right upper quadrant ultrasound dated October 09, 2017. FINDINGS: Gallbladder: Unchanged 1.6 cm gallstone near the gallbladder neck. A large amount of sludge is noted. No gallbladder wall thickening. No sonographic Murphy sign noted by sonographer. Common bile duct: Diameter: 3 mm, normal. Liver: No focal lesion identified. Within normal limits in parenchymal echogenicity. Portal vein is patent on color Doppler imaging with normal direction of blood flow towards the liver. IMPRESSION: 1.  Cholelithiasis and sludge without sonographic evidence of acute cholecystitis. Electronically Signed   By: Titus Dubin M.D.   On: 03/16/2018 15:28    Procedures Procedures (including critical care time)  Medications Ordered in ED Medications - No data to display   Initial Impression / Assessment and Plan / ED Course  I have reviewed the triage vital signs and the nursing notes.  Pertinent labs & imaging results that were available during my care of the patient were reviewed by me and considered in my medical decision making (see chart for details).     Final Clinical Impressions(s) / ED Diagnoses   Final diagnoses:  RUQ abdominal pain  Acute cystitis with hematuria  Calculus of gallbladder without cholecystitis without obstruction   27 year old female presenting with right upper quadrant abdominal pain that has been constant since she woke up this morning.  She has a history of cholelithiasis.  Her vital signs are stable here and she is afebrile.  She has had no nausea, vomiting.  Have low suspicion for acute cholecystitis or choledocholithiasis however given her complaints of right upper quadrant pain and reports of tenderness on exam will obtain right upper quadrant ultrasound at this time to rule out  acute acute cholecystitis choledocholithiasis.  Lab work is grossly reassuring.  She does have white blood cell count of 2.5.  No anemia.  No electrolyte abnormalities on CMP.  Alk phos elevated 1.3 however no gross elevation.  Liver enzymes and kidney function is within normal limits.  Lipase normal.  Negative pregnancy test.  UA with nitrates and leukocytes. RUQ [place] With cholelithiasis and sludge without evidence of acute cholecystitis.  After results of lab work and right upper quadrant ultrasound resulted, went to recheck patient and discussed results and she was no longer in the room.  Believe that she eloped.  ED Discharge Orders        Ordered    cephALEXin (KEFLEX) 500 MG capsule  2 times daily     03/16/18 1611    acetaminophen (TYLENOL) 325 MG tablet  Every 6 hours PRN     03/16/18 1620       Kaiyana Bedore S, PA-C 03/16/18 2328    Duffy Bruce, MD 03/17/18 [phone]

## 2018-05-16 ENCOUNTER — Other Ambulatory Visit: Payer: Self-pay

## 2018-05-16 ENCOUNTER — Emergency Department (HOSPITAL_COMMUNITY)
Admission: EM | Admit: 2018-05-16 | Discharge: 2018-05-16 | Disposition: A | Discharge status: Home / Self Care | Payer: Self-pay | Attending: Emergency Medicine | Admitting: Emergency Medicine

## 2018-05-16 ENCOUNTER — Encounter (HOSPITAL_COMMUNITY): Payer: Self-pay

## 2018-05-16 ENCOUNTER — Emergency Department (HOSPITAL_COMMUNITY): Payer: Self-pay

## 2018-05-16 DIAGNOSIS — K8020 Calculus of gallbladder without cholecystitis without obstruction: Secondary | ICD-10-CM | POA: Insufficient documentation

## 2018-05-16 DIAGNOSIS — F17210 Nicotine dependence, cigarettes, uncomplicated: Secondary | ICD-10-CM | POA: Insufficient documentation

## 2018-05-16 LAB — COMPREHENSIVE METABOLIC PANEL
ALBUMIN: 4.6 g/dL (ref 3.5–5.0)
ALT: 13 U/L — ABNORMAL LOW (ref 14–54)
ANION GAP: 9 (ref 5–15)
AST: 25 U/L (ref 15–41)
Alkaline Phosphatase: 45 U/L (ref 38–126)
BILIRUBIN TOTAL: 1 mg/dL (ref 0.3–1.2)
BUN: 11 mg/dL (ref 6–20)
CO2: 23 mmol/L (ref 22–32)
Calcium: 9 mg/dL (ref 8.9–10.3)
Chloride: 106 mmol/L (ref 101–111)
Creatinine, Ser: 0.65 mg/dL (ref 0.44–1.00)
GLUCOSE: 122 mg/dL — AB (ref 65–99)
POTASSIUM: 3.6 mmol/L (ref 3.5–5.1)
Sodium: 138 mmol/L (ref 135–145)
Total Protein: 8 g/dL (ref 6.5–8.1)

## 2018-05-16 LAB — CBC
HEMATOCRIT: 36.3 % (ref 36.0–46.0)
HEMOGLOBIN: 11.2 g/dL — AB (ref 12.0–15.0)
MCH: 24 pg — ABNORMAL LOW (ref 26.0–34.0)
MCHC: 30.9 g/dL (ref 30.0–36.0)
MCV: 77.7 fL — ABNORMAL LOW (ref 78.0–100.0)
Platelets: 165 10*3/uL (ref 150–400)
RBC: 4.67 MIL/uL (ref 3.87–5.11)
RDW: 14.2 % (ref 11.5–15.5)
WBC: 5.2 10*3/uL (ref 4.0–10.5)

## 2018-05-16 LAB — I-STAT BETA HCG BLOOD, ED (MC, WL, AP ONLY): I-stat hCG, quantitative: <5 m[IU]/mL (ref <5)

## 2018-05-16 LAB — LIPASE, BLOOD: Lipase: 42 U/L (ref 11–51)

## 2018-05-16 MED ORDER — ONDANSETRON HCL 4 MG/2ML IJ SOLN
4.0000 mg | Freq: Once | INTRAMUSCULAR | Status: AC
  Administered 2018-05-16: 4 mg via INTRAVENOUS
  Filled 2018-05-16: qty 2

## 2018-05-16 MED ORDER — MORPHINE SULFATE (PF) 4 MG/ML IV SOLN
4.0000 mg | Freq: Once | INTRAVENOUS | Status: AC
  Administered 2018-05-16: 4 mg via INTRAVENOUS
  Filled 2018-05-16: qty 1

## 2018-05-16 MED ORDER — ONDANSETRON HCL 4 MG PO TABS: 4.0000 mg | ORAL_TABLET | Freq: Four times a day (QID) | ORAL | 0 refills | Status: DC

## 2018-05-16 MED ORDER — GI COCKTAIL ~~LOC~~
30.0000 mL | Freq: Once | ORAL | Status: AC
  Administered 2018-05-16: 30 mL via ORAL
  Filled 2018-05-16: qty 30

## 2018-05-16 MED ORDER — SODIUM CHLORIDE 0.9 % IV BOLUS
1000.0000 mL | Freq: Once | INTRAVENOUS | Status: AC
  Administered 2018-05-16: 1000 mL via INTRAVENOUS

## 2018-05-16 MED ORDER — ACETAMINOPHEN 500 MG PO TABS: 500.0000 mg | ORAL_TABLET | Freq: Four times a day (QID) | ORAL | 0 refills | Status: DC | PRN

## 2018-05-16 NOTE — ED Notes (Signed)
Pt aware of need for urine specimen. 

## 2018-05-16 NOTE — ED Notes (Signed)
Attempted IV access x 2, unsuccessful. IV medication delay at this time.

## 2018-05-16 NOTE — ED Provider Notes (Signed)
[facility]-EMERGENCY DEPT Provider Note   CSN: [id number] Arrival date & time: 05/16/18  0753     History   Chief Complaint Chief Complaint  Patient presents with  . Abdominal Pain  . Emesis    HPI Kristen Garza is a 27 y.o. female.  HPI   27 year old female presenting for evaluation of abdominal pain.  Patient report around 4 hours ago she was awoke with severe pain to her upper abdomen.  Pain is sharp, stabbing, persistent with associated nausea and vomiting.  States that she vomited up food that initiate the night before and now vomiting up yellow emesis.  Pain is moderate to severe, persistent, nothing seems to make it better or worse.  Last bowel movement was yesterday.  No associated fever, chills, lightheadedness, dizziness, chest pain, shortness breath, productive cough, back pain, dysuria, hematuria.  Pain felt similar to abdominal pain that she had earlier this year when she was diagnosed with gallstone.  She denies alcohol abuse, but does report occasional use of marijuana, last use was yesterday.  No specific treatment tried at home.  Last menstrual period was earlier this month.  Past Medical History:  Diagnosis Date  . Acute pyelonephritis 01/2017  . Fever   . Medical history non-contributory   . No pertinent past medical history     Patient Active Problem List   Diagnosis Date Noted  . UTI (urinary tract infection) 02/12/2017  . Acute pyelonephritis 02/12/2017  . Fever 02/12/2017  . URI (upper respiratory infection) 02/12/2017  . Nausea and vomiting 02/12/2017    Past Surgical History:  Procedure Laterality Date  . NO PAST SURGERIES       OB History    Gravida  3   Para  2   Term  2   Preterm      AB      Living  2     SAB      TAB      Ectopic      Multiple      Live Births  2            Home Medications    Prior to Admission medications   Medication Sig Start Date End Date Taking? Authorizing  Provider  acetaminophen (TYLENOL) 325 MG tablet Take 2 tablets (650 mg total) by mouth every 6 (six) hours as needed. Do not take more than 4000mg  of tylenol per day 03/16/18   Couture, Cortni S, PA-C  HYDROcodone-acetaminophen (NORCO/VICODIN) 5-325 MG tablet Take 1 tablet by mouth every 6 (six) hours as needed. 03/06/18   Horton, Mayer Masker, MD  ibuprofen (ADVIL,MOTRIN) 800 MG tablet Take 1 tablet (800 mg total) by mouth every 6 (six) hours as needed. Patient not taking: Reported on 03/06/2018 02/14/17   Joseph Art, DO  ondansetron (ZOFRAN) 4 MG tablet Take 1 tablet (4 mg total) by mouth every 8 (eight) hours as needed for nausea or vomiting. 03/06/18   Horton, Mayer Masker, MD    Family History History reviewed. No pertinent family history.  Social History Social History   Tobacco Use  . Smoking status: Current Every Day Smoker    Packs/day: 0.25    Years: 2.00    Pack years: 0.50    Types: Cigarettes  . Smokeless tobacco: Never Used  Substance Use Topics  . Alcohol use: No  . Drug use: No     Allergies   Patient has no known allergies.   Review of Systems  Review of Systems  All other systems reviewed and are negative.    Physical Exam Updated Vital Signs BP (!) 138/93 (BP Location: Left Arm)   Pulse 75   Temp 98.3 F (36.8 C) (Oral)   Resp 18   Ht 5\' 4"  (1.626 m)   Wt 63.5 kg (140 lb)   LMP 05/01/2018 (Approximate)   SpO2 100%   BMI 24.03 kg/m   Physical Exam  Constitutional: She appears well-developed and well-nourished.  Patient appears uncomfortable but nontoxic.  HENT:  Head: Atraumatic.  Eyes: Conjunctivae are normal.  Neck: Neck supple.  Cardiovascular: Normal rate and regular rhythm.  Pulmonary/Chest: Effort normal and breath sounds normal. She has no wheezes. She has no rhonchi. She has no rales.  Abdominal: Soft. Normal appearance and bowel sounds are normal. There is tenderness in the right upper quadrant and epigastric area. There is positive  Murphy's sign. There is no tenderness at McBurney's point.  Neurological: She is alert.  Skin: No rash noted.  Psychiatric: She has a normal mood and affect.  Nursing note and vitals reviewed.    ED Treatments / Results  Labs (all labs ordered are listed, but only abnormal results are displayed) Labs Reviewed  COMPREHENSIVE METABOLIC PANEL - Abnormal; Notable for the following components:      Result Value   Glucose, Bld 122 (*)    ALT 13 (*)    All other components within normal limits  CBC - Abnormal; Notable for the following components:   Hemoglobin 11.2 (*)    MCV 77.7 (*)    MCH 24.0 (*)    All other components within normal limits  LIPASE, BLOOD  URINALYSIS, ROUTINE W REFLEX MICROSCOPIC  I-STAT BETA HCG BLOOD, ED ([facility], [facility], [facility] ONLY)    EKG None  Radiology [place] Abdomen Limited  Result Date: 05/16/2018 CLINICAL DATA:  Upper abdominal pain EXAM: ULTRASOUND ABDOMEN LIMITED RIGHT UPPER QUADRANT COMPARISON:  Ultrasound right upper quadrant March 16, 2018 FINDINGS: Gallbladder: Within the gallbladder, there are echogenic foci which move and shadow consistent with cholelithiasis. Largest gallstone measures 1.5 cm in length. Mild sludge is also noted in the gallbladder. There is no gallbladder wall thickening or pericholecystic fluid. No sonographic Murphy sign noted by sonographer. Common bile duct: Diameter: 5 mm. No intrahepatic or extrahepatic biliary duct dilatation. Liver: No focal lesion identified. Within normal limits in parenchymal echogenicity. Portal vein is patent on color Doppler imaging with normal direction of blood flow towards the liver. IMPRESSION: Cholelithiasis as well as mild sludge in gallbladder. No gallbladder wall thickening or pericholecystic fluid. Study otherwise unremarkable. Electronically Signed   By: Bretta BangWilliam  Woodruff III M.D.   On: 05/16/2018 09:27    Procedures Procedures (including critical care time)  Medications Ordered in ED Medications    sodium chloride 0.9 % bolus 1,000 mL (0 mLs Intravenous Stopped 05/16/18 0954)  gi cocktail (Maalox,Lidocaine,Donnatal) (30 mLs Oral Given 05/16/18 0838)  ondansetron (ZOFRAN) injection 4 mg (4 mg Intravenous Given 05/16/18 0852)  morphine 4 MG/ML injection 4 mg (4 mg Intravenous Given 05/16/18 0852)     Initial Impression / Assessment and Plan / ED Course  I have reviewed the triage vital signs and the nursing notes.  Pertinent labs & imaging results that were available during my care of the patient were reviewed by me and considered in my medical decision making (see chart for details).     BP (!) 138/93 (BP Location: Left Arm)   Pulse 75   Temp 98.3  F (36.8 C) (Oral)   Resp 18   Ht 5\' 4"  (1.626 m)   Wt 63.5 kg (140 lb)   LMP 05/01/2018 (Approximate)   SpO2 100%   BMI 24.03 kg/m    Final Clinical Impressions(s) / ED Diagnoses   Final diagnoses:  Calculus of gallbladder without cholecystitis without obstruction    ED Discharge Orders        Ordered    ondansetron (ZOFRAN) 4 MG tablet  Every 6 hours     05/16/18 1043    acetaminophen (TYLENOL) 500 MG tablet  Every 6 hours PRN     05/16/18 1043     8:24 AM Patient with known history of cholelithiasis here with epigastric and right upper quadrant abdominal pain likely suggestive of gallbladder etiology.  She is afebrile.  No peritoneal sign.  Work-up initiated.  10:46 AM Labs are reassuring, abdominal ultrasound showing no evidence of cholecystitis but they are evidence of cholelithiasis consistent with prior ultrasound results.  Symptoms improved with treatment currently and patient tolerates p.o.  Patient has not follow-up with [facility] for her symptoms in the past.  I spent time encourage patient to follow-up with surgeon for further management of her recurrent problem.  I recommend avoid eating fatty food as it can worsen her symptoms.  Patient discharged home with antinausea medication.  Return  precautions discussed.   Fayrene Helper, PA-C 05/16/18 1047    Derwood Kaplan, MD 05/16/18 [phone]

## 2018-05-16 NOTE — Discharge Instructions (Signed)
Please avoid fatty food as it can worsening your pain.  [facility] and [facility] of your gallstones.  Return to the ER if you have any concerns.

## 2018-05-16 NOTE — ED Notes (Signed)
Patient provided with oral fluids, educated patient to inform staff of any n/v or other difficulties

## 2018-05-16 NOTE — ED Triage Notes (Signed)
Patient c/o mid abdominal pain and vomiting since 0400 today.

## 2018-05-16 NOTE — ED Notes (Signed)
Ultrasound at bedside

## 2018-08-08 IMAGING — US US ABDOMEN LIMITED
1 series · 14 of 25 positions shown · non-contrast
Comparison: Abdominal and pelvic CT scan February 12, 2017

CLINICAL DATA: Right upper quadrant pain

EXAM:
ULTRASOUND ABDOMEN LIMITED RIGHT UPPER QUADRANT

[Series 1: us abdomen limited · 0.22mm/px · 14 of 65 slices shown]
[im 1/65]
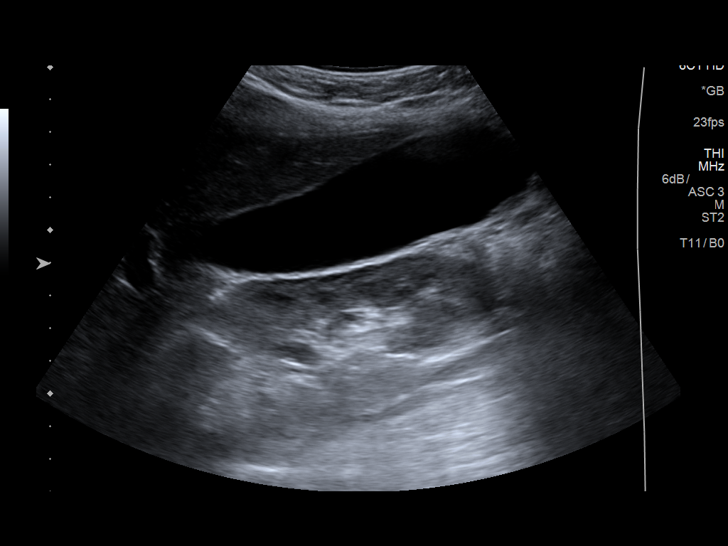
[im 6/65]
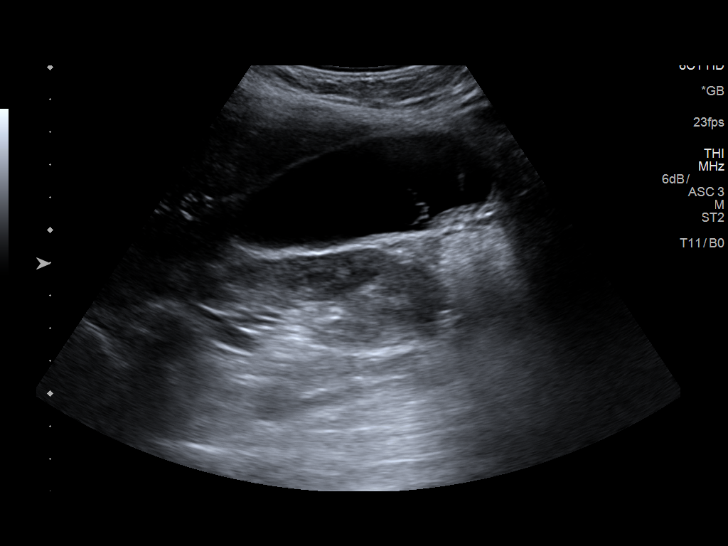
[im 11/65]
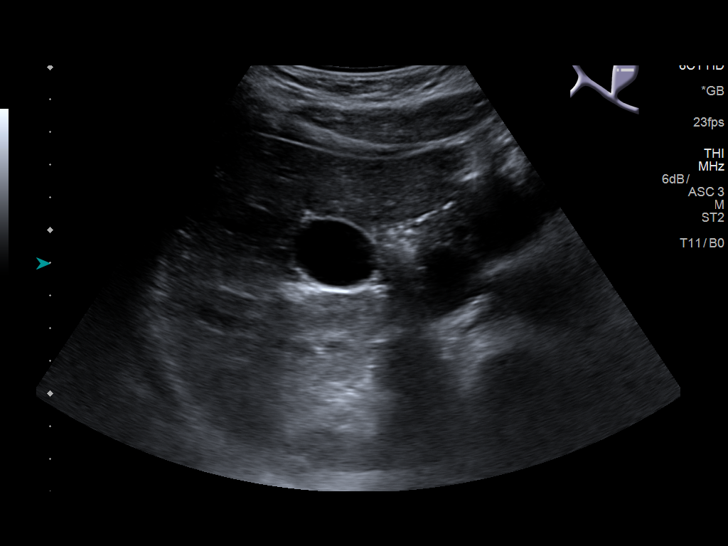
[im 17/65]
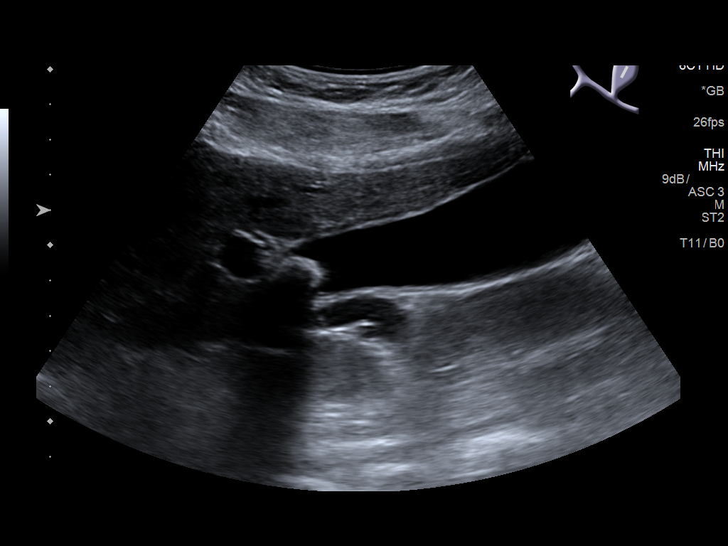
[im 22/65]
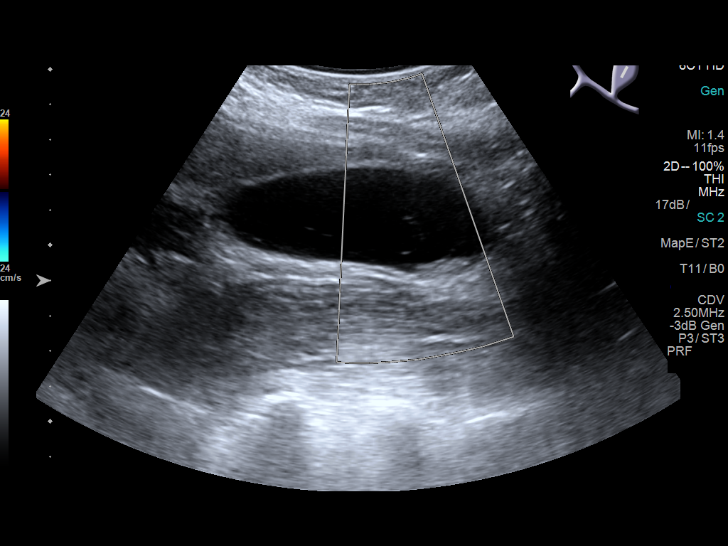
[im 25/65]
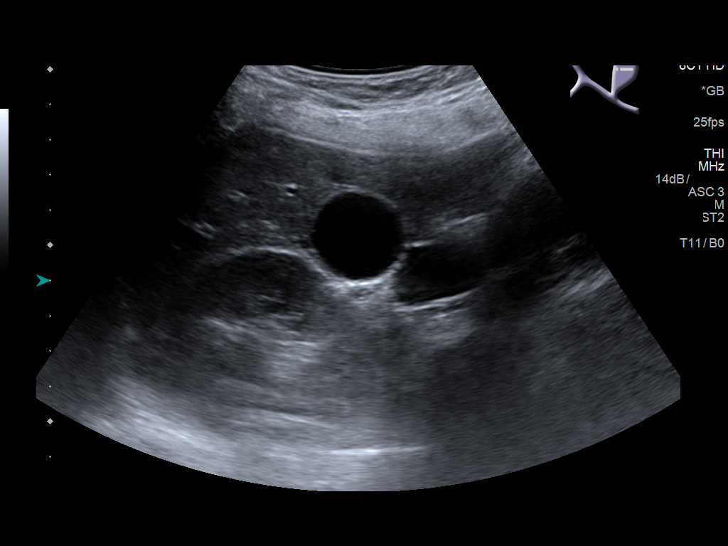
[im 30/65]
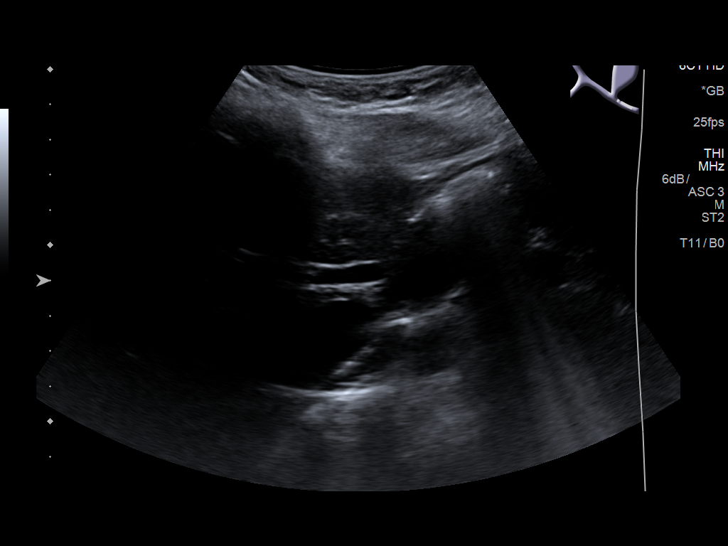
[im 35/65]
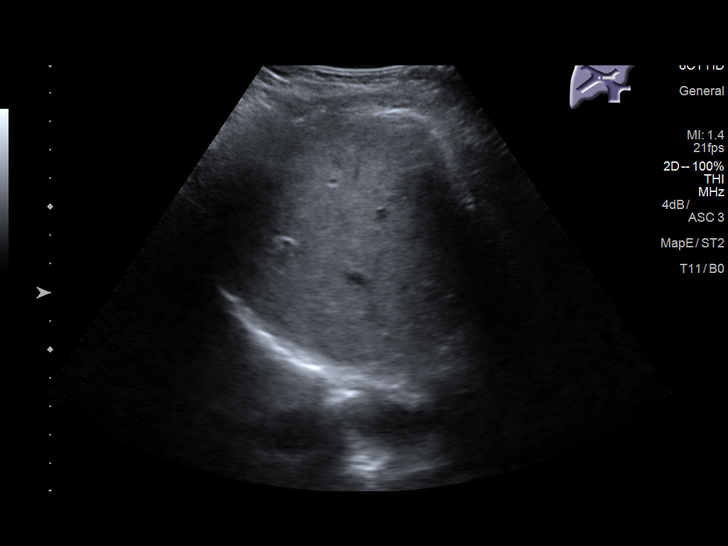
[im 41/65]
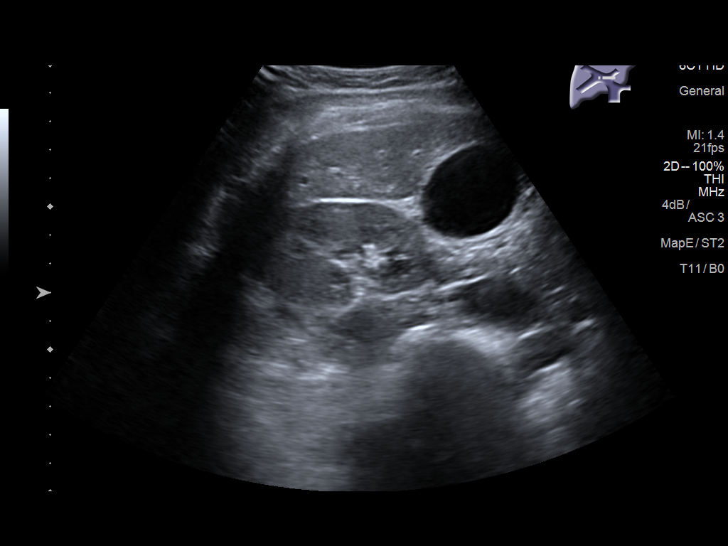
[im 43/65]
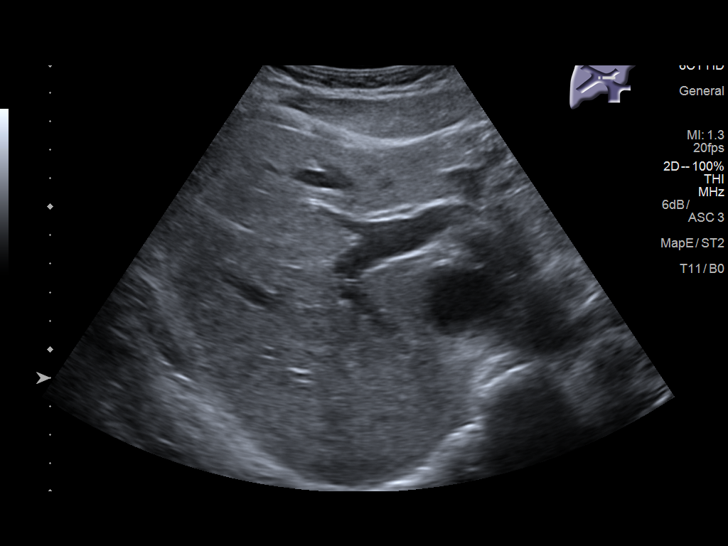
[im 49/65]
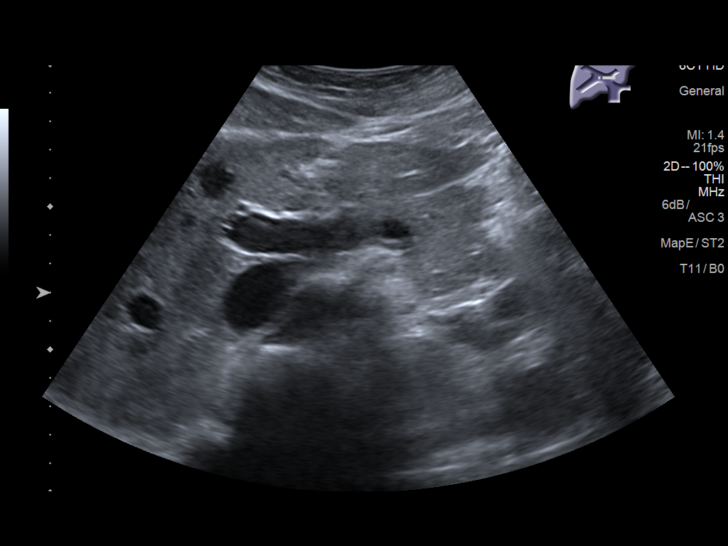
[im 54/65]
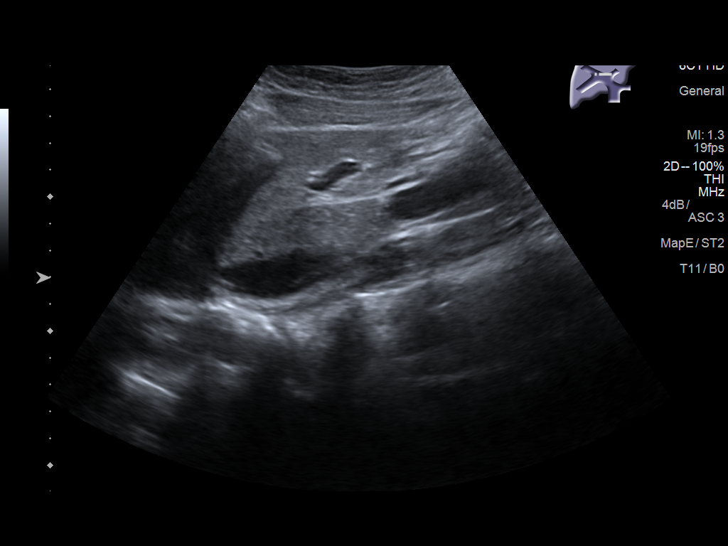
[im 59/65]
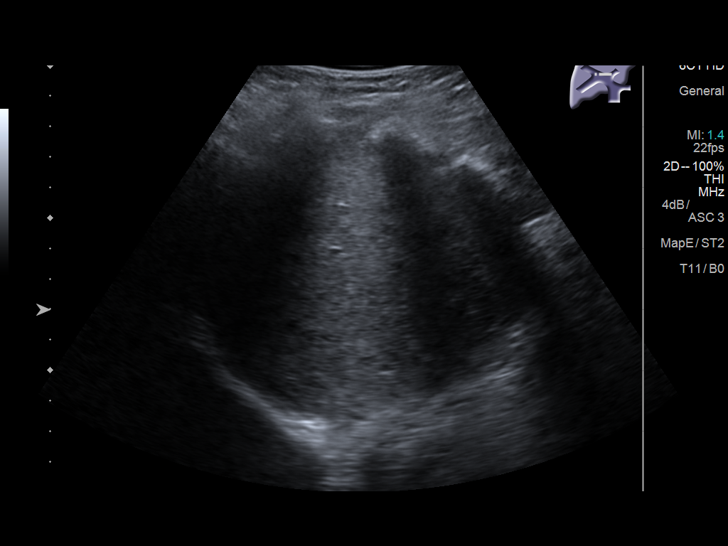
[im 65/65]
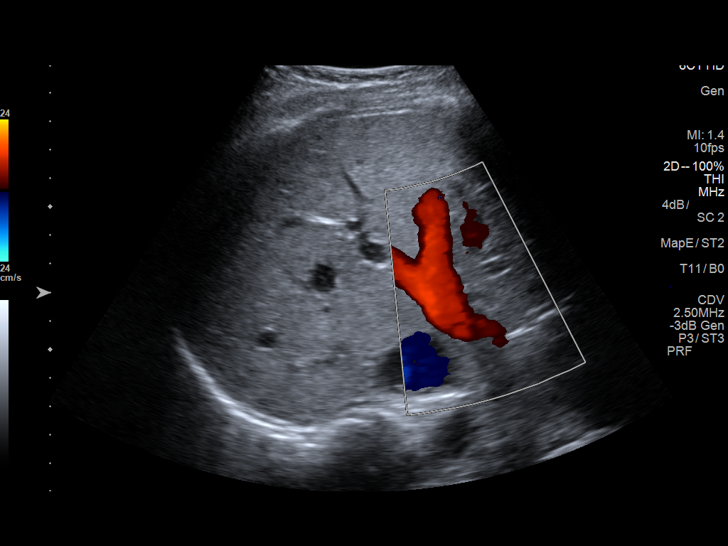

[14 of 25 positions shown; findings below may reference images not displayed]

FINDINGS: Gallbladder:

The gallbladder is adequately distended. There is an echogenic stone
measuring 1.7 cm near the gallbladder neck. A small amount of sludge
is present. There is no gallbladder wall thickening, pericholecystic
fluid, or positive sonographic Murphy's sign.

Common bile duct:

Diameter: 5.3 mm

Liver:

No focal lesion identified. Within normal limits in parenchymal
echogenicity. Portal vein is patent on color Doppler imaging with
normal direction of blood flow towards the liver.
IMPRESSION: Gallstones and sludge without evidence of acute cholecystitis.

Normal appearance of the liver and common bile duct.

## 2018-12-09 ENCOUNTER — Emergency Department (HOSPITAL_COMMUNITY)
Admission: EM | Admit: 2018-12-09 | Discharge: 2018-12-09 | Disposition: A | Discharge status: Home / Self Care | Payer: Self-pay | Attending: Emergency Medicine | Admitting: Emergency Medicine

## 2018-12-09 ENCOUNTER — Other Ambulatory Visit: Payer: Self-pay

## 2018-12-09 ENCOUNTER — Encounter (HOSPITAL_COMMUNITY): Payer: Self-pay | Admitting: Emergency Medicine

## 2018-12-09 ENCOUNTER — Emergency Department (HOSPITAL_COMMUNITY): Payer: Self-pay

## 2018-12-09 DIAGNOSIS — F17210 Nicotine dependence, cigarettes, uncomplicated: Secondary | ICD-10-CM | POA: Insufficient documentation

## 2018-12-09 DIAGNOSIS — K8020 Calculus of gallbladder without cholecystitis without obstruction: Secondary | ICD-10-CM

## 2018-12-09 DIAGNOSIS — K8050 Calculus of bile duct without cholangitis or cholecystitis without obstruction: Secondary | ICD-10-CM

## 2018-12-09 LAB — COMPREHENSIVE METABOLIC PANEL
ALBUMIN: 4.4 g/dL (ref 3.5–5.0)
ALT: 14 U/L (ref 0–44)
AST: 19 U/L (ref 15–41)
Alkaline Phosphatase: 48 U/L (ref 38–126)
Anion gap: 8 (ref 5–15)
BUN: 9 mg/dL (ref 6–20)
CO2: 27 mmol/L (ref 22–32)
Calcium: 8.9 mg/dL (ref 8.9–10.3)
Chloride: 106 mmol/L (ref 98–111)
Creatinine, Ser: 0.67 mg/dL (ref 0.44–1.00)
GFR calc Af Amer: >60 mL/min (ref >60)
GFR calc non Af Amer: >60 mL/min (ref >60)
GLUCOSE: 98 mg/dL (ref 70–99)
Potassium: 3.2 mmol/L — ABNORMAL LOW (ref 3.5–5.1)
SODIUM: 141 mmol/L (ref 135–145)
Total Bilirubin: 0.7 mg/dL (ref 0.3–1.2)
Total Protein: 7.8 g/dL (ref 6.5–8.1)

## 2018-12-09 LAB — CBC
HCT: 36.2 % (ref 36.0–46.0)
Hemoglobin: 10.6 g/dL — ABNORMAL LOW (ref 12.0–15.0)
MCH: 22.9 pg — ABNORMAL LOW (ref 26.0–34.0)
MCHC: 29.3 g/dL — ABNORMAL LOW (ref 30.0–36.0)
MCV: 78.4 fL — ABNORMAL LOW (ref 80.0–100.0)
NRBC: 0 % (ref 0.0–0.2)
Platelets: 247 10*3/uL (ref 150–400)
RBC: 4.62 MIL/uL (ref 3.87–5.11)
RDW: 14.7 % (ref 11.5–15.5)
WBC: 3.9 10*3/uL — AB (ref 4.0–10.5)

## 2018-12-09 LAB — LIPASE, BLOOD: Lipase: 62 U/L — ABNORMAL HIGH (ref 11–51)

## 2018-12-09 MED ORDER — POTASSIUM CHLORIDE CRYS ER 20 MEQ PO TBCR
40.0000 meq | EXTENDED_RELEASE_TABLET | Freq: Once | ORAL | Status: AC
  Administered 2018-12-09: 40 meq via ORAL
  Filled 2018-12-09: qty 2

## 2018-12-09 MED ORDER — ONDANSETRON HCL 4 MG/2ML IJ SOLN
4.0000 mg | Freq: Once | INTRAMUSCULAR | Status: AC
  Administered 2018-12-09: 4 mg via INTRAVENOUS
  Filled 2018-12-09: qty 2

## 2018-12-09 MED ORDER — IBUPROFEN 200 MG PO TABS
400.0000 mg | ORAL_TABLET | Freq: Once | ORAL | Status: AC
  Administered 2018-12-09: 400 mg via ORAL
  Filled 2018-12-09: qty 2

## 2018-12-09 MED ORDER — ONDANSETRON 8 MG PO TBDP: 8.0000 mg | ORAL_TABLET | Freq: Three times a day (TID) | ORAL | 0 refills | Status: AC | PRN

## 2018-12-09 MED ORDER — HYDROMORPHONE HCL 1 MG/ML IJ SOLN
1.0000 mg | Freq: Once | INTRAMUSCULAR | Status: AC
  Administered 2018-12-09: 1 mg via INTRAVENOUS
  Filled 2018-12-09: qty 1

## 2018-12-09 MED ORDER — SODIUM CHLORIDE 0.9 % IV BOLUS
1000.0000 mL | Freq: Once | INTRAVENOUS | Status: AC
  Administered 2018-12-09: 1000 mL via INTRAVENOUS

## 2018-12-09 MED ORDER — FAMOTIDINE IN NACL 20-0.9 MG/50ML-% IV SOLN
20.0000 mg | Freq: Once | INTRAVENOUS | Status: AC
  Administered 2018-12-09: 20 mg via INTRAVENOUS
  Filled 2018-12-09: qty 50

## 2018-12-09 NOTE — ED Provider Notes (Signed)
[facility]-EMERGENCY DEPT Provider Note   CSN: [id number] Arrival date & time: 12/09/18  [id number]     History   Chief Complaint Chief Complaint  Patient presents with  . Abdominal Pain  . Emesis    HPI Kristen Garza is a 28 y.o. female.  Patient c/o ruq pain since last night. Pain acute onset, constant, dull, mod-sev, radiates to back. Similar to prior pain w gallstones. No fevers. Nausea, vomiting, not bloody or bilious. No diarrhea. No abd distension. No dysuria or gu c/o. Denies chest pain or sob.   The history is provided by the patient.  Abdominal Pain  Associated symptoms: vomiting   Associated symptoms: no chest pain, no cough, no dysuria, no fever, no shortness of breath and no sore throat   Emesis  Associated symptoms: abdominal pain   Associated symptoms: no cough, no fever, no headaches and no sore throat     Past Medical History:  Diagnosis Date  . Acute pyelonephritis 01/2017  . Fever   . Medical history non-contributory   . No pertinent past medical history     Patient Active Problem List   Diagnosis Date Noted  . UTI (urinary tract infection) 02/12/2017  . Acute pyelonephritis 02/12/2017  . Fever 02/12/2017  . URI (upper respiratory infection) 02/12/2017  . Nausea and vomiting 02/12/2017    Past Surgical History:  Procedure Laterality Date  . NO PAST SURGERIES       OB History    Gravida  3   Para  2   Term  2   Preterm      AB      Living  2     SAB      TAB      Ectopic      Multiple      Live Births  2            Home Medications    Prior to Admission medications   Medication Sig Start Date End Date Taking? Authorizing Provider  acetaminophen (TYLENOL) 500 MG tablet Take 1 tablet (500 mg total) by mouth every 6 (six) hours as needed. 05/16/18   Fayrene Helperran, [place], PA-C  ondansetron (ZOFRAN) 4 MG tablet Take 1 tablet (4 mg total) by mouth every 6 (six) hours. 05/16/18   Fayrene Helperran, [place], PA-C    Family  History No family history on file.  Social History Social History   Tobacco Use  . Smoking status: Current Every Day Smoker    Packs/day: 0.25    Years: 2.00    Pack years: 0.50    Types: Cigarettes  . Smokeless tobacco: Never Used  Substance Use Topics  . Alcohol use: No  . Drug use: No     Allergies   Patient has no known allergies.   Review of Systems Review of Systems  Constitutional: Negative for fever.  HENT: Negative for sore throat.   Eyes: Negative for redness.  Respiratory: Negative for cough and shortness of breath.   Cardiovascular: Negative for chest pain.  Gastrointestinal: Positive for abdominal pain and vomiting.  Genitourinary: Negative for dysuria and flank pain.  Musculoskeletal: Negative for neck pain.  Skin: Negative for rash.  Neurological: Negative for headaches.  Hematological: Does not bruise/bleed easily.  Psychiatric/Behavioral: Negative for confusion.     Physical Exam Updated Vital Signs BP (!) 139/93 (BP Location: Right Arm)   Pulse 74   Temp 98.1 F (36.7 C) (Oral)   Resp 19   Ht  1.613 m (5' 3.5")   LMP 11/25/2018   SpO2 100%   BMI 24.41 kg/m   Physical Exam Vitals signs and nursing note reviewed.  Constitutional:      Appearance: Normal appearance. She is well-developed.  HENT:     Head: Atraumatic.     Nose: Nose normal.     Mouth/Throat:     Mouth: Mucous membranes are moist.  Eyes:     General: No scleral icterus.    Conjunctiva/sclera: Conjunctivae normal.     Pupils: Pupils are equal, round, and reactive to light.  Neck:     Musculoskeletal: Normal range of motion and neck supple. No neck rigidity or muscular tenderness.     Trachea: No tracheal deviation.  Cardiovascular:     Rate and Rhythm: Normal rate and regular rhythm.     Pulses: Normal pulses.     Heart sounds: Normal heart sounds. No murmur. No friction rub. No gallop.   Pulmonary:     Effort: Pulmonary effort is normal. No respiratory distress.      Breath sounds: Normal breath sounds.  Abdominal:     General: Bowel sounds are normal. There is no distension.     Palpations: Abdomen is soft. There is no mass.     Tenderness: There is abdominal tenderness. There is no guarding or rebound.     Hernia: No hernia is present.     Comments: Upper abd tenderness.   Genitourinary:    Comments: No cva tenderness.  Musculoskeletal:        General: No swelling.  Skin:    General: Skin is warm and dry.     Findings: No rash.  Neurological:     Mental Status: She is alert.     Comments: Alert, speech normal.   Psychiatric:        Mood and Affect: Mood normal.      ED Treatments / Results  Labs (all labs ordered are listed, but only abnormal results are displayed) Results for orders placed or performed during the hospital encounter of 12/09/18  CBC  Result Value Ref Range   WBC 3.9 (L) 4.0 - 10.5 K/uL   RBC 4.62 3.87 - 5.11 MIL/uL   Hemoglobin 10.6 (L) 12.0 - 15.0 g/dL   HCT 97.6 73.4 - 19.3 %   MCV 78.4 (L) 80.0 - 100.0 fL   MCH 22.9 (L) 26.0 - 34.0 pg   MCHC 29.3 (L) 30.0 - 36.0 g/dL   RDW 79.0 24.0 - 97.3 %   Platelets 247 150 - 400 K/uL   nRBC 0.0 0.0 - 0.2 %  Comprehensive metabolic panel  Result Value Ref Range   Sodium 141 135 - 145 mmol/L   Potassium 3.2 (L) 3.5 - 5.1 mmol/L   Chloride 106 98 - 111 mmol/L   CO2 27 22 - 32 mmol/L   Glucose, Bld 98 70 - 99 mg/dL   BUN 9 6 - 20 mg/dL   Creatinine, Ser 5.32 0.44 - 1.00 mg/dL   Calcium 8.9 8.9 - 99.2 mg/dL   Total Protein 7.8 6.5 - 8.1 g/dL   Albumin 4.4 3.5 - 5.0 g/dL   AST 19 15 - 41 U/L   ALT 14 0 - 44 U/L   Alkaline Phosphatase 48 38 - 126 U/L   Total Bilirubin 0.7 0.3 - 1.2 mg/dL   GFR calc non Af Amer >60 >60 mL/min   GFR calc Af Amer >60 >60 mL/min   Anion gap 8 5 -  15  Lipase, blood  Result Value Ref Range   Lipase 62 (H) 11 - 51 U/L   US Abdomen Limited  Result Date: 12/09/2018 CLINICAL DATA:  Right upper quadrant pain for 1 day. Cholelithiasis.  EXAM: ULTRASOUND ABDOMEN LIMITED RIGHT UPPER QUADRANT COMPARISON:  None. FINDINGS: Gallbladder: A non-mobile gallstone is seen within the gallbladder neck which measures approximately 1.6 cm. No evidence of gallbladder wall thickening or pericholecystic fluid. No sonographic Murphy sign noted by sonographer. Common bile duct: Diameter: 5 mm, within normal limits. Liver: No focal lesion identified. Within normal limits in parenchymal echogenicity. Portal vein is patent on color Doppler imaging with normal direction of blood flow towards the liver. IMPRESSION: Cholelithiasis, without definite sonographic signs of cholecystitis. No evidence of biliary ductal dilatation. Electronically Signed   By: Myles Rosenthal M.D.   On: 12/09/2018 13:18    EKG None  Radiology US Abdomen Limited  Result Date: 12/09/2018 CLINICAL DATA:  Right upper quadrant pain for 1 day. Cholelithiasis. EXAM: ULTRASOUND ABDOMEN LIMITED RIGHT UPPER QUADRANT COMPARISON:  None. FINDINGS: Gallbladder: A non-mobile gallstone is seen within the gallbladder neck which measures approximately 1.6 cm. No evidence of gallbladder wall thickening or pericholecystic fluid. No sonographic Murphy sign noted by sonographer. Common bile duct: Diameter: 5 mm, within normal limits. Liver: No focal lesion identified. Within normal limits in parenchymal echogenicity. Portal vein is patent on color Doppler imaging with normal direction of blood flow towards the liver. IMPRESSION: Cholelithiasis, without definite sonographic signs of cholecystitis. No evidence of biliary ductal dilatation. Electronically Signed   By: Myles Rosenthal M.D.   On: 12/09/2018 13:18    Procedures Procedures (including critical care time)  Medications Ordered in ED Medications  sodium chloride 0.9 % bolus 1,000 mL (has no administration in time range)  HYDROmorphone (DILAUDID) injection 1 mg (has no administration in time range)  ondansetron (ZOFRAN) injection 4 mg (has no  administration in time range)  famotidine (PEPCID) IVPB 20 mg premix (has no administration in time range)     Initial Impression / Assessment and Plan / ED Course  I have reviewed the triage vital signs and the nursing notes.  Pertinent labs & imaging results that were available during my care of the patient were reviewed by me and considered in my medical decision making (see chart for details).  Iv ns bolus. Dilaudid 1 mg iv. zofran iv.   Labs sent.  Reviewed nursing notes and prior charts for additional history. Pt noted to have several prior u/s's showing gallstones.   Labs reviewed - k mildly low. kcl po.  Initially pain persistent, therefore repeat u/s done.  U/s reviewed - gallstones, no acute cholecystitis.   Pain improved. abd soft nt. No emesis, tolerating po.   Pt currently appears stable for d/c.      Final Clinical Impressions(s) / ED Diagnoses   Final diagnoses:  None    ED Discharge Orders    None       Cathren Laine, MD 12/09/18 1331

## 2018-12-09 NOTE — ED Triage Notes (Signed)
Pt c/o RUQ pain that started last night with n/v. Denies bowel or urinary problems.

## 2018-12-09 NOTE — Discharge Instructions (Addendum)
It was our pleasure to provide your ER care today - we hope that you feel better.  Your ultrasound shows gallstones. Follow up with general surgeon in the next 1-2 weeks - call office tomorrow to arrange appointment.   Take acetaminophen and/or ibuprofen as need. Take zofran as need for nausea.   From today's lab tests, your potassium level is mildly low (3.2) - eat plenty of fruits and vegetables, and follow up with primary care doctor in 1-2 weeks.   Return to ER if worse, new symptoms, fevers, persistent vomiting, severe/worsening pain, other concern.  You were given pain medication in the ER - no driving for the next 6 hours.

## 2018-12-09 NOTE — ED Notes (Signed)
ED Provider at bedside. 

## 2019-12-11 IMAGING — US US ABDOMEN LIMITED
1 series · 14 of 25 positions shown · non-contrast
Comparison: None.

CLINICAL DATA: Right upper quadrant pain for 1 day. Cholelithiasis.

EXAM:
ULTRASOUND ABDOMEN LIMITED RIGHT UPPER QUADRANT

[Series 1: us abdomen limited · 14 of 56 slices shown]
[im 1/56]
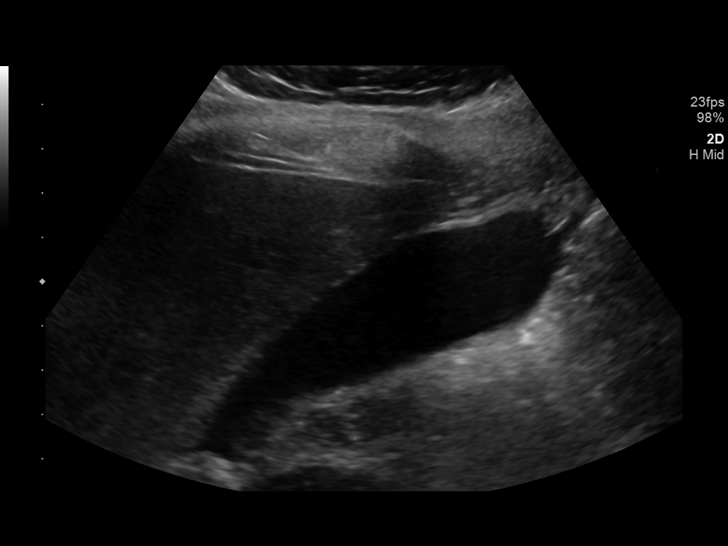
[im 5/56]
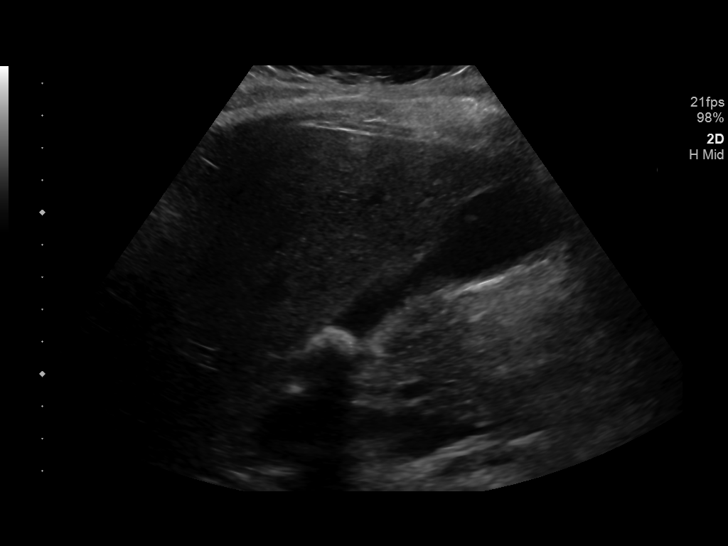
[im 10/56]
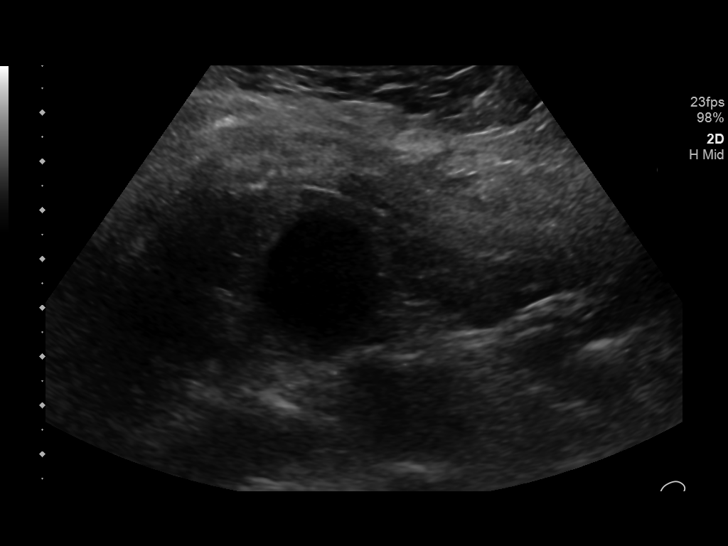
[im 14/56]
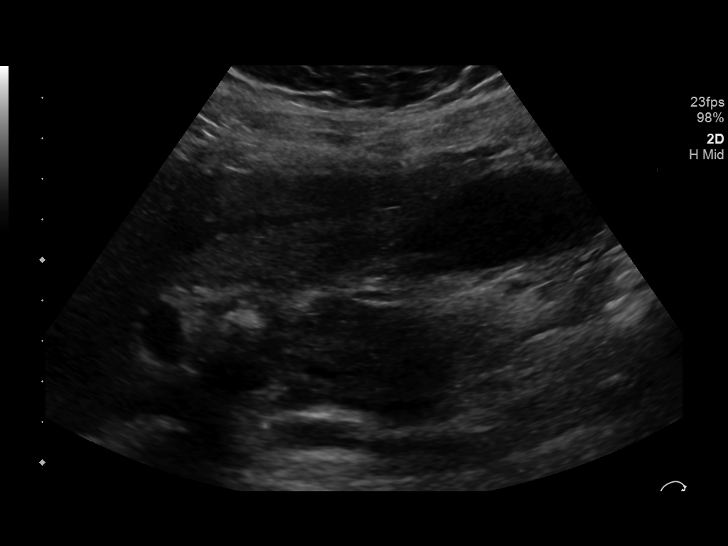
[im 19/56]
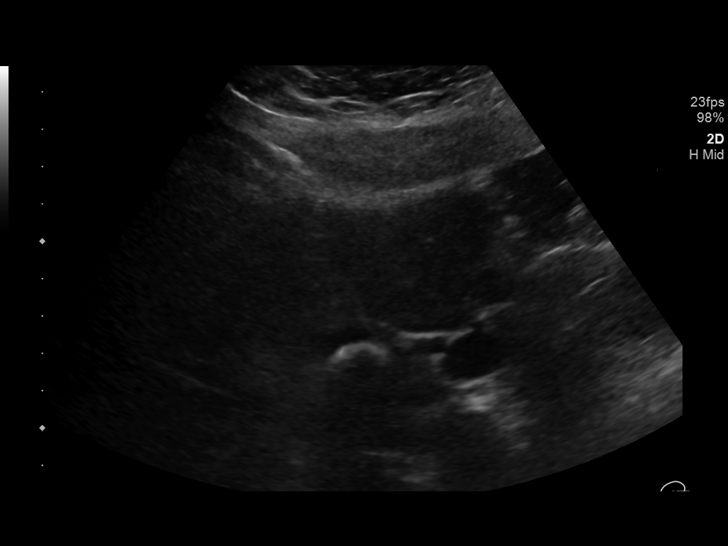
[im 21/56]
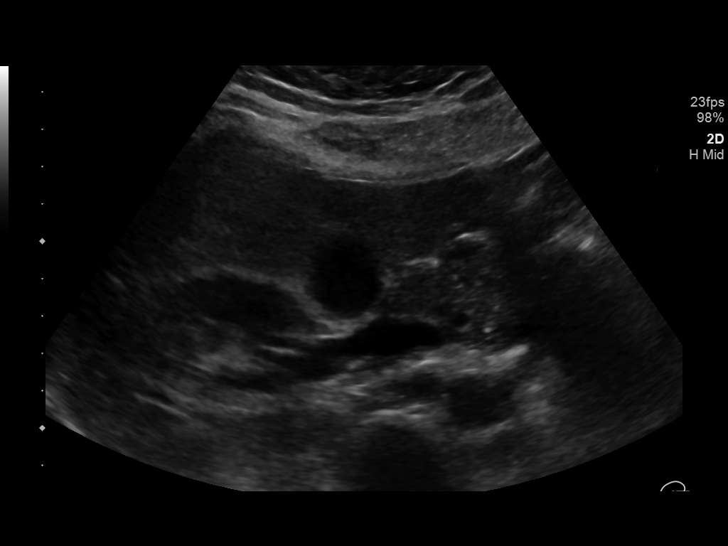
[im 26/56]
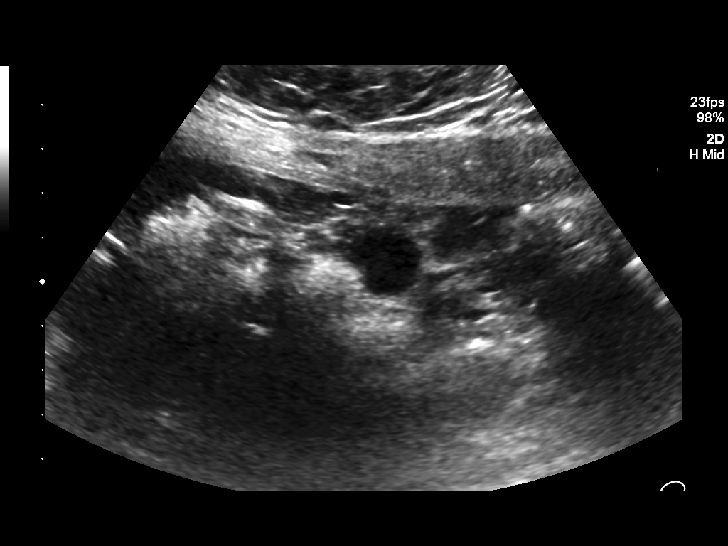
[im 30/56]
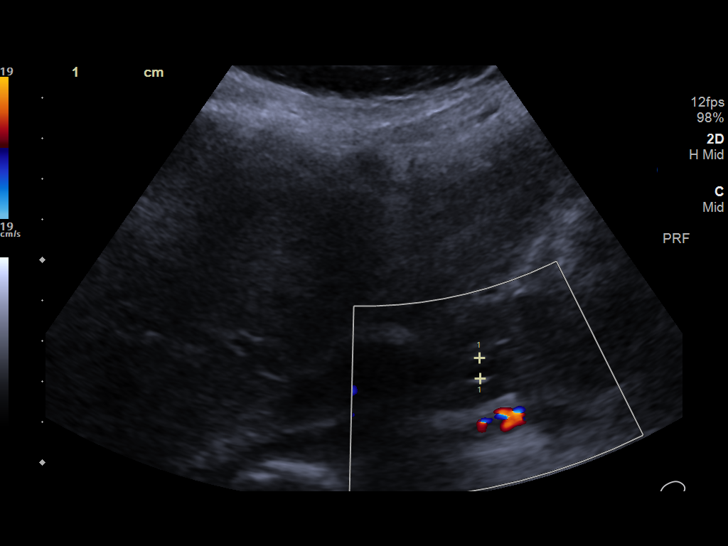
[im 35/56]
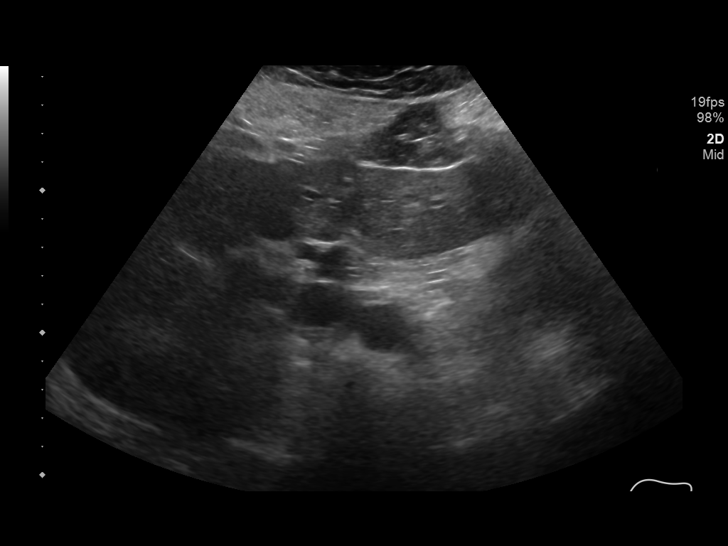
[im 37/56]
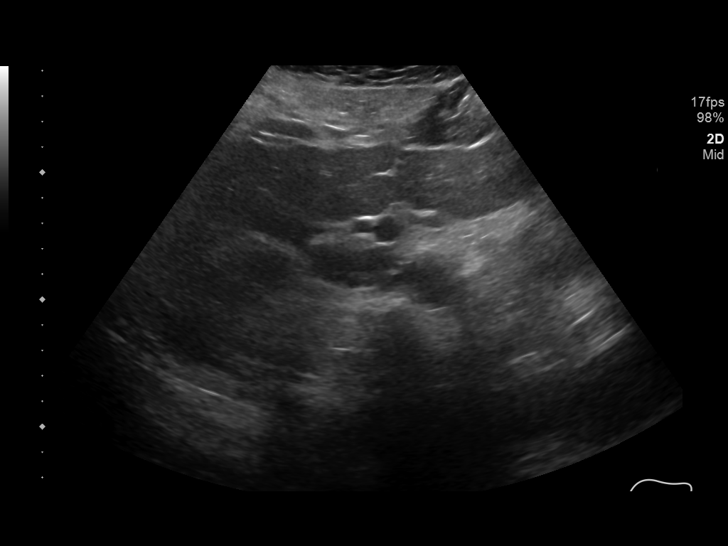
[im 42/56]
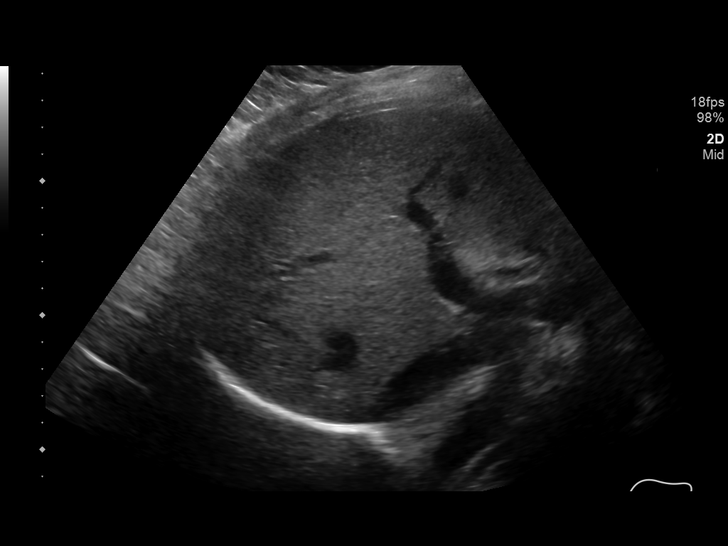
[im 46/56]
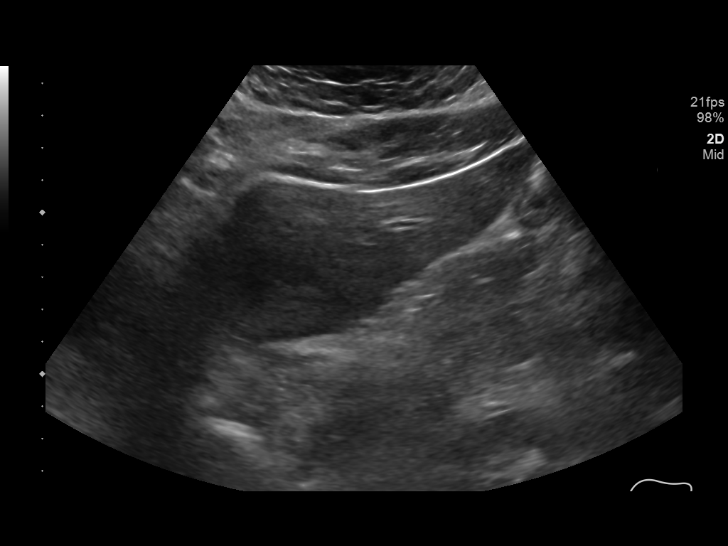
[im 51/56]
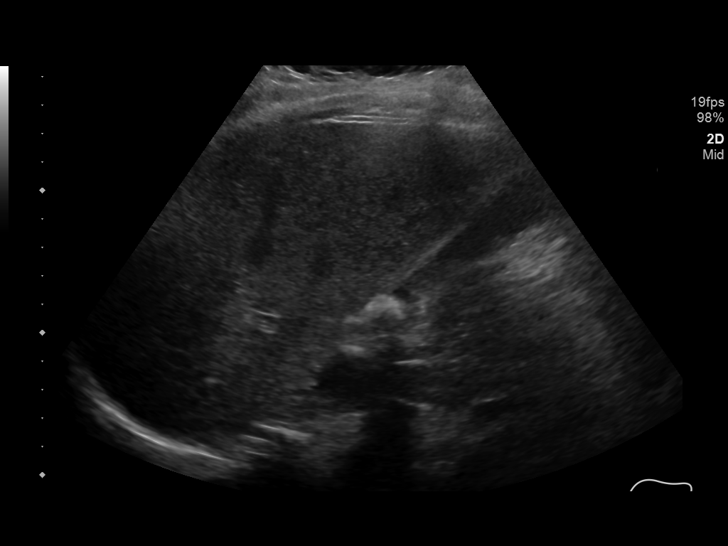
[im 56/56]
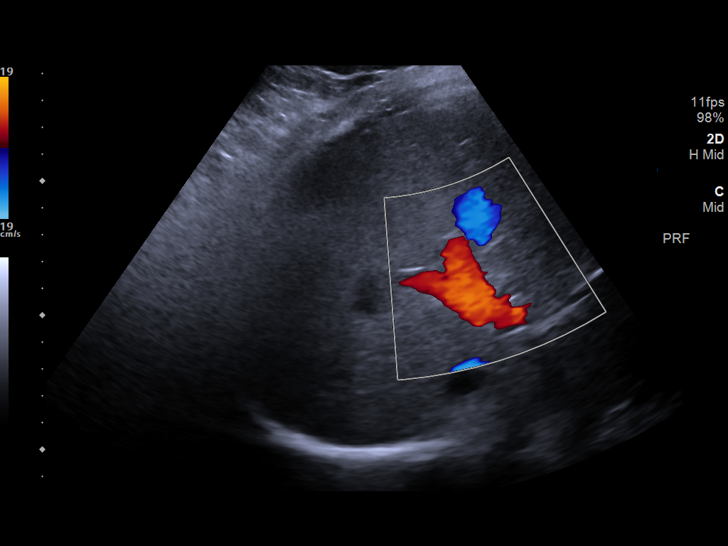

[14 of 25 positions shown; findings below may reference images not displayed]

FINDINGS: Gallbladder:

A non-mobile gallstone is seen within the gallbladder neck which
measures approximately 1.6 cm. No evidence of gallbladder wall
thickening or pericholecystic fluid. No sonographic Murphy sign
noted by sonographer.

Common bile duct:

Diameter: 5 mm, within normal limits.

Liver:

No focal lesion identified. Within normal limits in parenchymal
echogenicity. Portal vein is patent on color Doppler imaging with
normal direction of blood flow towards the liver.
IMPRESSION: Cholelithiasis, without definite sonographic signs of cholecystitis.

No evidence of biliary ductal dilatation.
